# Patient Record
Sex: Male | Born: 1970 | Race: White | Hispanic: No | Marital: Married | Smoking: Former smoker
Health system: Southern US, Community
[De-identification: ages and names within clinical notes are randomized; demographics above are authoritative.]

## PROBLEM LIST (undated history)

## (undated) DIAGNOSIS — T4145XA Adverse effect of unspecified anesthetic, initial encounter: Secondary | ICD-10-CM

## (undated) DIAGNOSIS — J189 Pneumonia, unspecified organism: Secondary | ICD-10-CM

## (undated) DIAGNOSIS — T8859XA Other complications of anesthesia, initial encounter: Secondary | ICD-10-CM

## (undated) DIAGNOSIS — K5900 Constipation, unspecified: Secondary | ICD-10-CM

## (undated) HISTORY — PX: HERNIA REPAIR: SHX51

## (undated) HISTORY — PX: NASAL POLYP SURGERY: SHX186

## (undated) HISTORY — PX: BACK SURGERY: SHX140

## (undated) HISTORY — PX: NASAL SEPTUM SURGERY: SHX37

## (undated) HISTORY — PX: WISDOM TOOTH EXTRACTION: SHX21

---

## 2008-11-14 ENCOUNTER — Emergency Department (HOSPITAL_BASED_OUTPATIENT_CLINIC_OR_DEPARTMENT_OTHER): Admission: EM | Admit: 2008-11-14 | Discharge: 2008-11-14 | Payer: Self-pay | Admitting: Emergency Medicine

## 2013-09-29 ENCOUNTER — Ambulatory Visit (INDEPENDENT_AMBULATORY_CARE_PROVIDER_SITE_OTHER): Payer: 59

## 2013-09-29 ENCOUNTER — Encounter: Payer: Self-pay | Admitting: Sports Medicine

## 2013-09-29 ENCOUNTER — Ambulatory Visit (INDEPENDENT_AMBULATORY_CARE_PROVIDER_SITE_OTHER): Payer: 59 | Admitting: Sports Medicine

## 2013-09-29 VITALS — BP 127/76 | HR 74 | Ht 72.0 in | Wt 235.0 lb

## 2013-09-29 DIAGNOSIS — M5137 Other intervertebral disc degeneration, lumbosacral region: Secondary | ICD-10-CM

## 2013-09-29 DIAGNOSIS — M5416 Radiculopathy, lumbar region: Secondary | ICD-10-CM | POA: Insufficient documentation

## 2013-09-29 DIAGNOSIS — IMO0002 Reserved for concepts with insufficient information to code with codable children: Secondary | ICD-10-CM

## 2013-09-29 DIAGNOSIS — G35 Multiple sclerosis: Secondary | ICD-10-CM

## 2013-09-29 MED ORDER — MELOXICAM 15 MG PO TABS: ORAL_TABLET | ORAL | Status: DC

## 2013-09-29 MED ORDER — PREDNISONE 50 MG PO TABS: ORAL_TABLET | ORAL | Status: DC

## 2013-09-29 MED ORDER — KETOROLAC TROMETHAMINE 30 MG/ML IJ SOLN
30.0000 mg | Freq: Once | INTRAMUSCULAR | Status: AC
  Administered 2013-09-29: 30 mg via INTRAMUSCULAR

## 2013-09-29 MED ORDER — CYCLOBENZAPRINE HCL 10 MG PO TABS: ORAL_TABLET | ORAL | Status: DC

## 2013-09-29 NOTE — Progress Notes (Signed)
   Subjective:    I'm seeing this patient as a consultation for:  Dr. Olivia CanterWilliam Kelly  CC: Low back pain  HPI: This is a pleasant 43 year old male, for the past several months he's had pain in his low back radiating down the left leg in an S1 distribution, worse with Valsalva, flexion, driving a car. He denies any bowel or bladder dysfunction, trauma, or saddle numbness. Symptoms are moderate, persistent.  Past medical history, Surgical history, Family history not pertinant except as noted below, Social history, Allergies, and medications have been entered into the medical record, reviewed, and no changes needed.   Review of Systems: No headache, visual changes, nausea, vomiting, diarrhea, constipation, dizziness, abdominal pain, skin rash, fevers, chills, night sweats, weight loss, swollen lymph nodes, body aches, joint swelling, muscle aches, chest pain, shortness of breath, mood changes, visual or auditory hallucinations.   Objective:   General: Well Developed, well nourished, and in no acute distress.  Neuro/Psych: Alert and oriented x3, extra-ocular muscles intact, able to move all 4 extremities, sensation grossly intact. Skin: Warm and dry, no rashes noted.  Respiratory: Not using accessory muscles, speaking in full sentences, trachea midline.  Cardiovascular: Pulses palpable, no extremity edema. Abdomen: Does not appear distended. Back Exam:  Inspection: Unremarkable  Motion: Flexion 45 deg, Extension 45 deg, Side Bending to 45 deg bilaterally,  Rotation to 45 deg bilaterally  SLR laying: Positive with reproduction of left leg S1 radicular symptoms.  XSLR laying: Negative  Palpable tenderness: None. FABER: negative. Sensory change: Gross sensation intact to all lumbar and sacral dermatomes.  Reflexes: 2+ at both patellar tendons, 2+ at achilles tendons, Babinski's downgoing.  Strength at foot  Plantar-flexion: 5/5 Dorsi-flexion: 5/5 Eversion: 5/5 Inversion: 5/5  Leg strength    Quad: 5/5 Hamstring: 5/5 Hip flexor: 5/5 Hip abductors: 5/5  Gait unremarkable.  X-ray show L3-L4, L4-L5, and L5-S1 degenerative changes.  Impression and Recommendations:   This case required medical decision making of moderate complexity.

## 2013-09-29 NOTE — Assessment & Plan Note (Signed)
Toradol 30, prednisone, Mobic, Flexeril at bedtime. X-rays, formal physical therapy. Return in one month, MRI for better. Likely left-sided S1

## 2013-10-17 ENCOUNTER — Ambulatory Visit: Payer: 59 | Admitting: Physical Therapy

## 2015-01-15 ENCOUNTER — Ambulatory Visit (HOSPITAL_COMMUNITY)
Admission: RE | Admit: 2015-01-15 | Discharge: 2015-01-15 | Disposition: A | Discharge status: Home / Self Care | Payer: 59 | Source: Ambulatory Visit | Attending: Sports Medicine | Admitting: Sports Medicine

## 2015-01-15 ENCOUNTER — Ambulatory Visit (HOSPITAL_COMMUNITY): Payer: 59

## 2015-01-15 ENCOUNTER — Encounter: Payer: Self-pay | Admitting: Sports Medicine

## 2015-01-15 ENCOUNTER — Ambulatory Visit (INDEPENDENT_AMBULATORY_CARE_PROVIDER_SITE_OTHER): Payer: 59 | Admitting: Sports Medicine

## 2015-01-15 DIAGNOSIS — M5416 Radiculopathy, lumbar region: Secondary | ICD-10-CM | POA: Diagnosis not present

## 2015-01-15 DIAGNOSIS — M5126 Other intervertebral disc displacement, lumbar region: Secondary | ICD-10-CM | POA: Insufficient documentation

## 2015-01-15 MED ORDER — KETOROLAC TROMETHAMINE 30 MG/ML IJ SOLN
30.0000 mg | Freq: Once | INTRAMUSCULAR | Status: AC
  Administered 2015-01-15: 30 mg via INTRAMUSCULAR

## 2015-01-15 MED ORDER — HYDROCODONE-ACETAMINOPHEN 5-325 MG PO TABS: 1.0000 | ORAL_TABLET | Freq: Three times a day (TID) | ORAL | Status: DC | PRN

## 2015-01-15 MED ORDER — METHYLPREDNISOLONE SODIUM SUCC 125 MG IJ SOLR
125.0000 mg | Freq: Once | INTRAMUSCULAR | Status: AC
  Administered 2015-01-15: 125 mg via INTRAMUSCULAR

## 2015-01-15 MED ORDER — PREDNISONE 10 MG (21) PO TBPK: ORAL_TABLET | ORAL | Status: DC

## 2015-01-15 NOTE — Progress Notes (Signed)
  Subjective:    CC: low back pain  HPI: Bobby Petersen returns, I saw him sometime ago with acute low back pain and he did well with conservative measures, unfortunately over the past few days he's had worsening low back pain to the point he has had progressive weakness in his legs, without any paresthesias. No bowel or bladder dysfunction, saddle numbness, no constitutional symptoms. He is having great typically walking, sitting. Pain is severe today.  Past medical history, Surgical history, Family history not pertinant except as noted below, Social history, Allergies, and medications have been entered into the medical record, reviewed, and no changes needed.   Review of Systems: No fevers, chills, night sweats, weight loss, chest pain, or shortness of breath.   Objective:    General: Well Developed, well nourished, and in no acute distress.  Neuro: Alert and oriented x3, extra-ocular muscles intact, sensation grossly intact.  HEENT: Normocephalic, atraumatic, pupils equal round reactive to light, neck supple, no masses, no lymphadenopathy, thyroid nonpalpable.  Skin: Warm and dry, no rashes. Cardiac: Regular rate and rhythm, no murmurs rubs or gallops, no lower extremity edema.  Respiratory: Clear to auscultation bilaterally. Not using accessory muscles, speaking in full sentences. Back Exam:  Inspection: Unremarkable  Motion: Flexion 45 deg, Extension 45 deg, Side Bending to 45 deg bilaterally,  Rotation to 45 deg bilaterally  SLR laying: Negative  XSLR laying: Negative  Palpable tenderness: None. FABER: negative. Sensory change: Gross sensation intact to all lumbar and sacral dermatomes.  Reflexes: 2+ at both patellar tendons, 2+ at achilles tendons, Babinski's downgoing.  Strength at foot  Plantar-flexion: 5/5 Dorsi-flexion: 5/5 Eversion: 5/5 Inversion: 5/5  Leg strength  Quad: 5/5 Hamstring: 5/5 Hip flexor: 5/5 Hip abductors: 5/5  Gait unremarkable.  Toradol 30, soluMedrol 125  intramuscular today  Impression and Recommendations:

## 2015-01-15 NOTE — Assessment & Plan Note (Addendum)
Recurrent severe back pain with progressive weakness.  Toradol 30, Solu-Medrol 125, prednisone taper. Formal physical therapy, with considering weakness we are going to proceed with an MRI. Patient informed of the importance of getting an MRI considering progressive weakness, he was scheduled for an MRI later today, but declined doing it today to my nurse, again, informed of risks, benefits, alternatives.

## 2015-01-17 ENCOUNTER — Ambulatory Visit (INDEPENDENT_AMBULATORY_CARE_PROVIDER_SITE_OTHER): Payer: 59 | Admitting: Sports Medicine

## 2015-01-17 ENCOUNTER — Encounter: Payer: Self-pay | Admitting: Sports Medicine

## 2015-01-17 VITALS — BP 117/66 | HR 67 | Wt 233.0 lb

## 2015-01-17 DIAGNOSIS — R635 Abnormal weight gain: Secondary | ICD-10-CM | POA: Diagnosis not present

## 2015-01-17 DIAGNOSIS — M5416 Radiculopathy, lumbar region: Secondary | ICD-10-CM

## 2015-01-17 MED ORDER — PHENTERMINE HCL 37.5 MG PO TABS: ORAL_TABLET | ORAL | Status: DC

## 2015-01-17 NOTE — Assessment & Plan Note (Signed)
Doing much better with conservative treatment. Needs to start physical therapy. We will also work on weight loss.

## 2015-01-17 NOTE — Assessment & Plan Note (Signed)
Starting phentermine. Return monthly for weight checks and refills. 

## 2015-01-17 NOTE — Progress Notes (Signed)
  Subjective:    CC: MRI results  HPI: Bobby Petersen returns, he had fairly severe left-sided lumbar radiculopathy, we treated him at the last visit with Toradol, Solu-Medrol and he is currently finishing a prednisone taper, we did obtain an MRI due to lower extremity weakness. He returns today feeling significantly better, not having yet started physical therapy.  Past medical history, Surgical history, Family history not pertinant except as noted below, Social history, Allergies, and medications have been entered into the medical record, reviewed, and no changes needed.   Review of Systems: No fevers, chills, night sweats, weight loss, chest pain, or shortness of breath.   Objective:    General: Well Developed, well nourished, and in no acute distress.  Neuro: Alert and oriented x3, extra-ocular muscles intact, sensation grossly intact.  HEENT: Normocephalic, atraumatic, pupils equal round reactive to light, neck supple, no masses, no lymphadenopathy, thyroid nonpalpable.  Skin: Warm and dry, no rashes. Cardiac: Regular rate and rhythm, no murmurs rubs or gallops, no lower extremity edema.  Respiratory: Clear to auscultation bilaterally. Not using accessory muscles, speaking in full sentences.  MRI was personally reviewed, there is L4-5 and L5-S1 degenerative disc disease, the largest protrusion is at the L4-L5 level with a left-sided paracentral and foraminal component.  Impression and Recommendations:    I spent 40 minutes with this patient, greater than 50% was face-to-face time counseling regarding the above diagnoses

## 2015-02-15 ENCOUNTER — Encounter: Payer: Self-pay | Admitting: Sports Medicine

## 2015-02-15 ENCOUNTER — Ambulatory Visit (INDEPENDENT_AMBULATORY_CARE_PROVIDER_SITE_OTHER): Payer: 59 | Admitting: Sports Medicine

## 2015-02-15 DIAGNOSIS — R635 Abnormal weight gain: Secondary | ICD-10-CM | POA: Diagnosis not present

## 2015-02-15 MED ORDER — TOPIRAMATE 50 MG PO TABS: ORAL_TABLET | ORAL | Status: DC

## 2015-02-15 MED ORDER — PHENTERMINE HCL 37.5 MG PO TABS: ORAL_TABLET | ORAL | Status: DC

## 2015-02-15 NOTE — Progress Notes (Signed)
  Subjective:    CC: Follow-up  HPI: Obesity:  5 pound weight loss.  Did lose 10 pounds but gained it back traveling on the road for the past 2 weeks. No adverse effects, happy with how things are going so far.  Past medical history, Surgical history, Family history not pertinant except as noted below, Social history, Allergies, and medications have been entered into the medical record, reviewed, and no changes needed.   Review of Systems: No fevers, chills, night sweats, weight loss, chest pain, or shortness of breath.   Objective:    General: Well Developed, well nourished, and in no acute distress.  Neuro: Alert and oriented x3, extra-ocular muscles intact, sensation grossly intact.  HEENT: Normocephalic, atraumatic, pupils equal round reactive to light, neck supple, no masses, no lymphadenopathy, thyroid nonpalpable.  Skin: Warm and dry, no rashes. Cardiac: Regular rate and rhythm, no murmurs rubs or gallops, no lower extremity edema.  Respiratory: Clear to auscultation bilaterally. Not using accessory muscles, speaking in full sentences.  Impression and Recommendations:

## 2015-02-15 NOTE — Assessment & Plan Note (Signed)
5 pound weight loss, restarting phentermine, adding Topamax. Next line return monthly for weight checks and refills, we are entering the second month.

## 2015-03-15 ENCOUNTER — Ambulatory Visit (INDEPENDENT_AMBULATORY_CARE_PROVIDER_SITE_OTHER): Payer: 59 | Admitting: Sports Medicine

## 2015-03-15 ENCOUNTER — Encounter: Payer: Self-pay | Admitting: Sports Medicine

## 2015-03-15 DIAGNOSIS — R635 Abnormal weight gain: Secondary | ICD-10-CM | POA: Diagnosis not present

## 2015-03-15 DIAGNOSIS — M5416 Radiculopathy, lumbar region: Secondary | ICD-10-CM | POA: Diagnosis not present

## 2015-03-15 MED ORDER — TOPIRAMATE 100 MG PO TABS: 100.0000 mg | ORAL_TABLET | Freq: Every day | ORAL | Status: DC

## 2015-03-15 MED ORDER — AMBULATORY NON FORMULARY MEDICATION: Status: DC

## 2015-03-15 MED ORDER — PREDNISONE 50 MG PO TABS: ORAL_TABLET | ORAL | Status: DC

## 2015-03-15 MED ORDER — PHENTERMINE HCL 37.5 MG PO TABS: ORAL_TABLET | ORAL | Status: DC

## 2015-03-15 NOTE — Assessment & Plan Note (Signed)
Multilevel L4-5 and L5-S1 degenerative disc disease, with clear contact of the L4-L5 disc to the intraspinal L5 nerve root on the left.  symptoms correspond to a left L5 versus S1 radiculopathy.  prednisone 50 mg for 5 days, I am also going to set him up for a L4-L5 interlaminar epidural.  physical therapy does need to focus more on spinal stabilization  And traction rather than modalities on the extremity.

## 2015-03-15 NOTE — Progress Notes (Signed)
  Subjective:    CC: Follow-up  HPI: Obesity: 4 pound weight loss after the second month of phentermine.  Left lumbar radiculopathy: Recurrence of pain, moderate, persistent, he is only had trigger point injections, has never had an epidural. Does have tripped coming up and needs some rapid relief. His radicular symptoms are in an L5 versus in S1 distribution.  Past medical history, Surgical history, Family history not pertinant except as noted below, Social history, Allergies, and medications have been entered into the medical record, reviewed, and no changes needed.   Review of Systems: No fevers, chills, night sweats, weight loss, chest pain, or shortness of breath.   Objective:    General: Well Developed, well nourished, and in no acute distress.  Neuro: Alert and oriented x3, extra-ocular muscles intact, sensation grossly intact.  HEENT: Normocephalic, atraumatic, pupils equal round reactive to light, neck supple, no masses, no lymphadenopathy, thyroid nonpalpable.  Skin: Warm and dry, no rashes. Cardiac: Regular rate and rhythm, no murmurs rubs or gallops, no lower extremity edema.  Respiratory: Clear to auscultation bilaterally. Not using accessory muscles, speaking in full sentences.  Lumbar spine MRI again reviewed and shows protrusions at the L4-L5 and L5-S1 levels, the L4-L5 disc does appear to contact the intraspinal left L5 nerve root  Impression and Recommendations:    I spent 25 minutes with this patient, greater than 50% was face-to-face time counseling regarding the above diagnoses

## 2015-03-15 NOTE — Assessment & Plan Note (Signed)
Good weight loss as we entered the third month. Doubling Topamax to 100 mg daily, continue phentermine, return in a month.

## 2015-03-25 ENCOUNTER — Telehealth: Payer: Self-pay

## 2015-03-25 NOTE — Telephone Encounter (Signed)
Pt currently taking topamax to assist with weight loss but states he feels "funny" on the medication and would like to d/c use. Pt was wondering if there are any steps in particular he should take to stop medications. Please advise.

## 2015-03-25 NOTE — Telephone Encounter (Signed)
Left detailed message with call back information if any questions.

## 2015-03-25 NOTE — Telephone Encounter (Signed)
He is supposed to feel odd for the first week or 2. Push through any odd sensations and they will resolve.

## 2015-04-04 ENCOUNTER — Ambulatory Visit
Admission: RE | Admit: 2015-04-04 | Discharge: 2015-04-04 | Disposition: A | Discharge status: Home / Self Care | Payer: 59 | Source: Ambulatory Visit | Attending: Sports Medicine | Admitting: Sports Medicine

## 2015-04-04 MED ORDER — METHYLPREDNISOLONE ACETATE 40 MG/ML INJ SUSP (RADIOLOG
120.0000 mg | Freq: Once | INTRAMUSCULAR | Status: AC
Start: 2015-04-04 — End: 2015-04-04
  Administered 2015-04-04: 120 mg via EPIDURAL

## 2015-04-04 MED ORDER — IOHEXOL 180 MG/ML  SOLN
15.0000 mL | Freq: Once | INTRAMUSCULAR | Status: AC | PRN
Start: 2015-04-04 — End: 2015-04-04
  Administered 2015-04-04: 15 mL via EPIDURAL

## 2015-04-04 NOTE — Discharge Instructions (Signed)

## 2015-04-10 ENCOUNTER — Telehealth: Payer: Self-pay

## 2015-04-10 DIAGNOSIS — M5416 Radiculopathy, lumbar region: Secondary | ICD-10-CM

## 2015-04-10 NOTE — Telephone Encounter (Signed)
Pt had epidural on 04/04/15 and states his pain is really bad again and was told to call in if he has this happened again. Please advise.

## 2015-04-11 NOTE — Telephone Encounter (Signed)
The injection will take 4-7 days to even start working, did he get any improvement, or is he starting to get any improvement?

## 2015-04-11 NOTE — Telephone Encounter (Signed)
Left message on VM with call back information

## 2015-04-12 ENCOUNTER — Ambulatory Visit (INDEPENDENT_AMBULATORY_CARE_PROVIDER_SITE_OTHER): Payer: 59 | Admitting: Sports Medicine

## 2015-04-12 ENCOUNTER — Encounter: Payer: Self-pay | Admitting: Sports Medicine

## 2015-04-12 DIAGNOSIS — M5416 Radiculopathy, lumbar region: Secondary | ICD-10-CM

## 2015-04-12 DIAGNOSIS — R635 Abnormal weight gain: Secondary | ICD-10-CM | POA: Diagnosis not present

## 2015-04-12 MED ORDER — GABAPENTIN 300 MG PO CAPS: ORAL_CAPSULE | ORAL | Status: DC

## 2015-04-12 MED ORDER — LIRAGLUTIDE -WEIGHT MANAGEMENT 18 MG/3ML ~~LOC~~ SOPN: 3.0000 mg | PEN_INJECTOR | Freq: Every day | SUBCUTANEOUS | Status: DC

## 2015-04-12 NOTE — Telephone Encounter (Signed)
He already had an L4-L5 interlaminar epidural, if they mean a left-sided L4-L5 transforaminal epidural (selective nerve root block but NOT a facet joint block) then this is a reasonable idea.

## 2015-04-12 NOTE — Assessment & Plan Note (Signed)
Has not lost any weight on phentermine or Topamax, switching to [place].  Return in a nurse visit to learn injections.

## 2015-04-12 NOTE — Assessment & Plan Note (Signed)
Persistent radiculopathy despite steroids, epidural injection. At this point he is a surgical candidate, referral to Dr. Yevette Edwards, also adding gabapentin to improve radicular pain.

## 2015-04-12 NOTE — Progress Notes (Signed)
  Subjective:    CC: Follow-up  HPI: Obesity: Hasn't lost any weight on phentermine and Topamax.  Left lumbar radicular the colon with an L4-L5 disc protrusion, unfortunately no response to a L4-L5 interlaminar epidural. Pain is predominantly radicular with minimal axial pain. At this point he is agreeable to consider surgical referral.  Past medical history, Surgical history, Family history not pertinant except as noted below, Social history, Allergies, and medications have been entered into the medical record, reviewed, and no changes needed.   Review of Systems: No fevers, chills, night sweats, weight loss, chest pain, or shortness of breath.   Objective:    General: Well Developed, well nourished, and in no acute distress.  Neuro: Alert and oriented x3, extra-ocular muscles intact, sensation grossly intact.  HEENT: Normocephalic, atraumatic, pupils equal round reactive to light, neck supple, no masses, no lymphadenopathy, thyroid nonpalpable.  Skin: Warm and dry, no rashes. Cardiac: Regular rate and rhythm, no murmurs rubs or gallops, no lower extremity edema.  Respiratory: Clear to auscultation bilaterally. Not using accessory muscles, speaking in full sentences.  Impression and Recommendations:   I spent 40 minutes with this patient, greater than 50% was face-to-face time counseling regarding the above diagnoses

## 2015-04-12 NOTE — Telephone Encounter (Signed)
Pt states he's talked to a provider who recommends an L4-5 block. Wanted to ask your opinion on if this would be an option.

## 2015-04-16 NOTE — Telephone Encounter (Signed)
Pt would like to know how he can get this set up. States he still has an appointment with the surgeon you referred him to on 04/22/15.

## 2015-04-17 ENCOUNTER — Telehealth: Payer: Self-pay | Admitting: Radiology

## 2015-04-17 NOTE — Telephone Encounter (Signed)
Pt notified of orders placed at [facility].

## 2015-04-17 NOTE — Telephone Encounter (Signed)
Ordered epidural, please contact [facility] to schedule.

## 2015-04-17 NOTE — Telephone Encounter (Signed)
Pt called about injection he had on 04/04/15. Explained blood sugar could definitely be elevated after injection. Also asked me about a severe flair up on 12 28 16, told him that was unlikely caused by the injections.

## 2015-05-03 ENCOUNTER — Other Ambulatory Visit: Payer: Self-pay | Admitting: Orthopedic Surgery

## 2015-05-07 ENCOUNTER — Encounter (HOSPITAL_COMMUNITY): Payer: Self-pay | Admitting: *Deleted

## 2015-05-07 MED ORDER — POVIDONE-IODINE 7.5 % EX SOLN
Freq: Once | CUTANEOUS | Status: DC
  Filled 2015-05-07: qty 118

## 2015-05-07 MED ORDER — VANCOMYCIN HCL 10 G IV SOLR
1500.0000 mg | INTRAVENOUS | Status: AC
  Administered 2015-05-08: 1500 mg via INTRAVENOUS
  Filled 2015-05-07: qty 1500

## 2015-05-08 ENCOUNTER — Encounter (HOSPITAL_COMMUNITY): Payer: Self-pay | Admitting: Surgery

## 2015-05-08 ENCOUNTER — Ambulatory Visit (HOSPITAL_COMMUNITY): Payer: 59

## 2015-05-08 ENCOUNTER — Ambulatory Visit (HOSPITAL_COMMUNITY): Payer: 59 | Admitting: Anesthesiology

## 2015-05-08 ENCOUNTER — Encounter (HOSPITAL_COMMUNITY)
Admission: RE | Disposition: A | Discharge status: Home / Self Care | Payer: Self-pay | Source: Ambulatory Visit | Attending: Orthopedic Surgery

## 2015-05-08 ENCOUNTER — Ambulatory Visit (HOSPITAL_COMMUNITY)
Admission: RE | Admit: 2015-05-08 | Discharge: 2015-05-08 | Disposition: A | Discharge status: Home / Self Care | Payer: 59 | Source: Ambulatory Visit | Attending: Orthopedic Surgery | Admitting: Orthopedic Surgery

## 2015-05-08 DIAGNOSIS — M5116 Intervertebral disc disorders with radiculopathy, lumbar region: Secondary | ICD-10-CM | POA: Diagnosis not present

## 2015-05-08 DIAGNOSIS — Z87891 Personal history of nicotine dependence: Secondary | ICD-10-CM | POA: Diagnosis not present

## 2015-05-08 DIAGNOSIS — M5416 Radiculopathy, lumbar region: Secondary | ICD-10-CM | POA: Diagnosis present

## 2015-05-08 DIAGNOSIS — Z79891 Long term (current) use of opiate analgesic: Secondary | ICD-10-CM | POA: Insufficient documentation

## 2015-05-08 DIAGNOSIS — Z01818 Encounter for other preprocedural examination: Secondary | ICD-10-CM

## 2015-05-08 DIAGNOSIS — Z419 Encounter for procedure for purposes other than remedying health state, unspecified: Secondary | ICD-10-CM

## 2015-05-08 HISTORY — DX: Adverse effect of unspecified anesthetic, initial encounter: T41.45XA

## 2015-05-08 HISTORY — DX: Constipation, unspecified: K59.00

## 2015-05-08 HISTORY — PX: LUMBAR LAMINECTOMY/DECOMPRESSION MICRODISCECTOMY: SHX5026

## 2015-05-08 HISTORY — DX: Other complications of anesthesia, initial encounter: T88.59XA

## 2015-05-08 HISTORY — DX: Pneumonia, unspecified organism: J18.9

## 2015-05-08 LAB — URINALYSIS, ROUTINE W REFLEX MICROSCOPIC
BILIRUBIN URINE: NEGATIVE
Glucose, UA: NEGATIVE mg/dL
HGB URINE DIPSTICK: NEGATIVE
KETONES UR: NEGATIVE mg/dL
Leukocytes, UA: NEGATIVE
NITRITE: NEGATIVE
PH: 6 (ref 5.0–8.0)
Protein, ur: NEGATIVE mg/dL
SPECIFIC GRAVITY, URINE: 1.021 (ref 1.005–1.030)

## 2015-05-08 LAB — CBC WITH DIFFERENTIAL/PLATELET
Basophils Absolute: 0 10*3/uL (ref 0.0–0.1)
Basophils Relative: 0 %
Eosinophils Absolute: 0.1 10*3/uL (ref 0.0–0.7)
Eosinophils Relative: 3 %
HEMATOCRIT: 41.3 % (ref 39.0–52.0)
HEMOGLOBIN: 14.3 g/dL (ref 13.0–17.0)
LYMPHS ABS: 2.4 10*3/uL (ref 0.7–4.0)
LYMPHS PCT: 44 %
MCH: 30.2 pg (ref 26.0–34.0)
MCHC: 34.6 g/dL (ref 30.0–36.0)
MCV: 87.3 fL (ref 78.0–100.0)
MONO ABS: 0.6 10*3/uL (ref 0.1–1.0)
MONOS PCT: 11 %
NEUTROS ABS: 2.2 10*3/uL (ref 1.7–7.7)
Neutrophils Relative %: 42 %
Platelets: 215 10*3/uL (ref 150–400)
RBC: 4.73 MIL/uL (ref 4.22–5.81)
RDW: 12.9 % (ref 11.5–15.5)
WBC: 5.3 10*3/uL (ref 4.0–10.5)

## 2015-05-08 LAB — COMPREHENSIVE METABOLIC PANEL
ALK PHOS: 66 U/L (ref 38–126)
ALT: 21 U/L (ref 17–63)
ANION GAP: 10 (ref 5–15)
AST: 19 U/L (ref 15–41)
Albumin: 3.9 g/dL (ref 3.5–5.0)
BILIRUBIN TOTAL: 0.7 mg/dL (ref 0.3–1.2)
BUN: 14 mg/dL (ref 6–20)
CALCIUM: 9.4 mg/dL (ref 8.9–10.3)
CO2: 25 mmol/L (ref 22–32)
Chloride: 105 mmol/L (ref 101–111)
Creatinine, Ser: 1.1 mg/dL (ref 0.61–1.24)
GFR calc Af Amer: >60 mL/min (ref >60)
Glucose, Bld: 102 mg/dL — ABNORMAL HIGH (ref 65–99)
POTASSIUM: 3.9 mmol/L (ref 3.5–5.1)
Sodium: 140 mmol/L (ref 135–145)
TOTAL PROTEIN: 6.6 g/dL (ref 6.5–8.1)

## 2015-05-08 LAB — SURGICAL PCR SCREEN
MRSA, PCR: NEGATIVE
STAPHYLOCOCCUS AUREUS: NEGATIVE

## 2015-05-08 LAB — APTT: aPTT: 29 seconds (ref 24–37)

## 2015-05-08 LAB — PROTIME-INR
INR: 1.01 (ref 0.00–1.49)
PROTHROMBIN TIME: 13.5 s (ref 11.6–15.2)

## 2015-05-08 SURGERY — LUMBAR LAMINECTOMY/DECOMPRESSION MICRODISCECTOMY
Anesthesia: General | Laterality: Left

## 2015-05-08 MED ORDER — 0.9 % SODIUM CHLORIDE (POUR BTL) OPTIME
TOPICAL | Status: DC | PRN
  Administered 2015-05-08: 1000 mL

## 2015-05-08 MED ORDER — FENTANYL CITRATE (PF) 100 MCG/2ML IJ SOLN
INTRAMUSCULAR | Status: DC | PRN
  Administered 2015-05-08 (×4): 50 ug via INTRAVENOUS
  Administered 2015-05-08: 300 ug via INTRAVENOUS

## 2015-05-08 MED ORDER — THROMBIN 20000 UNITS EX SOLR
CUTANEOUS | Status: AC
  Filled 2015-05-08: qty 20000

## 2015-05-08 MED ORDER — LIDOCAINE HCL (CARDIAC) 20 MG/ML IV SOLN
INTRAVENOUS | Status: DC | PRN
  Administered 2015-05-08: 60 mg via INTRAVENOUS

## 2015-05-08 MED ORDER — HYDROMORPHONE HCL 1 MG/ML IJ SOLN
INTRAMUSCULAR | Status: AC
  Filled 2015-05-08: qty 1

## 2015-05-08 MED ORDER — SUGAMMADEX SODIUM 200 MG/2ML IV SOLN
INTRAVENOUS | Status: AC
  Filled 2015-05-08: qty 2

## 2015-05-08 MED ORDER — SODIUM CHLORIDE 0.9 % IV SOLN: INTRAVENOUS | Status: DC

## 2015-05-08 MED ORDER — THROMBIN 20000 UNITS EX SOLR
CUTANEOUS | Status: DC | PRN
  Administered 2015-05-08: 20 mL via TOPICAL

## 2015-05-08 MED ORDER — MIDAZOLAM HCL 5 MG/5ML IJ SOLN
INTRAMUSCULAR | Status: DC | PRN
  Administered 2015-05-08 (×2): 2 mg via INTRAVENOUS

## 2015-05-08 MED ORDER — OXYCODONE HCL 5 MG PO TABS: 5.0000 mg | ORAL_TABLET | Freq: Once | ORAL | Status: DC | PRN

## 2015-05-08 MED ORDER — ACETAMINOPHEN 160 MG/5ML PO SOLN: 325.0000 mg | ORAL | Status: DC | PRN

## 2015-05-08 MED ORDER — HYDROMORPHONE HCL 1 MG/ML IJ SOLN
0.2500 mg | INTRAMUSCULAR | Status: DC | PRN
  Administered 2015-05-08 (×4): 0.5 mg via INTRAVENOUS

## 2015-05-08 MED ORDER — SODIUM CHLORIDE 0.9 % IV SOLN
INTRAVENOUS | Status: DC | PRN
  Administered 2015-05-08: 12:00:00 via INTRAVENOUS

## 2015-05-08 MED ORDER — ONDANSETRON HCL 4 MG/2ML IJ SOLN
INTRAMUSCULAR | Status: DC | PRN
  Administered 2015-05-08 (×2): 4 mg via INTRAVENOUS

## 2015-05-08 MED ORDER — FENTANYL CITRATE (PF) 250 MCG/5ML IJ SOLN
INTRAMUSCULAR | Status: AC
  Filled 2015-05-08: qty 10

## 2015-05-08 MED ORDER — ACETAMINOPHEN 325 MG PO TABS: 325.0000 mg | ORAL_TABLET | ORAL | Status: DC | PRN

## 2015-05-08 MED ORDER — SODIUM CHLORIDE 0.9 % IV SOLN
10.0000 mg | INTRAVENOUS | Status: DC | PRN
  Administered 2015-05-08: 20 ug/min via INTRAVENOUS

## 2015-05-08 MED ORDER — SUGAMMADEX SODIUM 200 MG/2ML IV SOLN
INTRAVENOUS | Status: DC | PRN
  Administered 2015-05-08: 200 mg via INTRAVENOUS

## 2015-05-08 MED ORDER — METHYLENE BLUE 1 % INJ SOLN
INTRAMUSCULAR | Status: DC | PRN
  Administered 2015-05-08: 1 mL via SUBMUCOSAL

## 2015-05-08 MED ORDER — ROCURONIUM BROMIDE 100 MG/10ML IV SOLN
INTRAVENOUS | Status: DC | PRN
  Administered 2015-05-08: 50 mg via INTRAVENOUS

## 2015-05-08 MED ORDER — KETAMINE HCL 100 MG/ML IJ SOLN
INTRAMUSCULAR | Status: AC
  Filled 2015-05-08: qty 1

## 2015-05-08 MED ORDER — OXYCODONE HCL 5 MG/5ML PO SOLN: 5.0000 mg | Freq: Once | ORAL | Status: DC | PRN

## 2015-05-08 MED ORDER — MIDAZOLAM HCL 2 MG/2ML IJ SOLN
INTRAMUSCULAR | Status: AC
  Filled 2015-05-08: qty 2

## 2015-05-08 MED ORDER — METHYLPREDNISOLONE ACETATE 40 MG/ML IJ SUSP
INTRAMUSCULAR | Status: DC | PRN
  Administered 2015-05-08: 40 mg

## 2015-05-08 MED ORDER — LACTATED RINGERS IV SOLN
INTRAVENOUS | Status: DC
  Administered 2015-05-08: 11:00:00 via INTRAVENOUS

## 2015-05-08 MED ORDER — LACTATED RINGERS IV SOLN
INTRAVENOUS | Status: DC | PRN
  Administered 2015-05-08 (×2): via INTRAVENOUS

## 2015-05-08 MED ORDER — KETAMINE HCL 100 MG/ML IJ SOLN
INTRAMUSCULAR | Status: DC | PRN
  Administered 2015-05-08: 10 mg via INTRAVENOUS
  Administered 2015-05-08: 50 mg via INTRAVENOUS
  Administered 2015-05-08 (×2): 20 mg via INTRAVENOUS

## 2015-05-08 MED ORDER — BUPIVACAINE-EPINEPHRINE 0.25% -1:200000 IJ SOLN
INTRAMUSCULAR | Status: DC | PRN
  Administered 2015-05-08: 20 mL

## 2015-05-08 MED ORDER — ARTIFICIAL TEARS OP OINT
TOPICAL_OINTMENT | OPHTHALMIC | Status: DC | PRN
  Administered 2015-05-08: 1 via OPHTHALMIC

## 2015-05-08 MED ORDER — METHYLPREDNISOLONE ACETATE 40 MG/ML IJ SUSP
INTRAMUSCULAR | Status: AC
  Filled 2015-05-08: qty 1

## 2015-05-08 MED ORDER — PROPOFOL 10 MG/ML IV BOLUS
INTRAVENOUS | Status: DC | PRN
  Administered 2015-05-08: 30 mg via INTRAVENOUS
  Administered 2015-05-08: 170 mg via INTRAVENOUS

## 2015-05-08 MED ORDER — METHYLENE BLUE 1 % INJ SOLN
INTRAMUSCULAR | Status: AC
  Filled 2015-05-08: qty 10

## 2015-05-08 MED ORDER — BUPIVACAINE-EPINEPHRINE (PF) 0.25% -1:200000 IJ SOLN
INTRAMUSCULAR | Status: AC
  Filled 2015-05-08: qty 30

## 2015-05-08 MED ORDER — HEMOSTATIC AGENTS (NO CHARGE) OPTIME
TOPICAL | Status: DC | PRN
  Administered 2015-05-08: 1 via TOPICAL

## 2015-05-08 SURGICAL SUPPLY — 69 items
BENZOIN TINCTURE PRP APPL 2/3 (GAUZE/BANDAGES/DRESSINGS) ×2 IMPLANT
BUR ROUND PRECISION 4.0 (BURR) ×2 IMPLANT
CANISTER SUCTION 2500CC (MISCELLANEOUS) ×2 IMPLANT
CARTRIDGE OIL MAESTRO DRILL (MISCELLANEOUS) ×1 IMPLANT
CLSR STERI-STRIP ANTIMIC 1/2X4 (GAUZE/BANDAGES/DRESSINGS) ×2 IMPLANT
CORDS BIPOLAR (ELECTRODE) ×2 IMPLANT
COVER SURGICAL LIGHT HANDLE (MISCELLANEOUS) ×2 IMPLANT
DIFFUSER DRILL AIR PNEUMATIC (MISCELLANEOUS) ×2 IMPLANT
DRAIN CHANNEL 15F RND FF W/TCR (WOUND CARE) IMPLANT
DRAPE POUCH INSTRU U-SHP 10X18 (DRAPES) ×4 IMPLANT
DRAPE SURG 17X23 STRL (DRAPES) ×8 IMPLANT
DURAPREP 26ML APPLICATOR (WOUND CARE) ×2 IMPLANT
ELECT BLADE 4.0 EZ CLEAN MEGAD (MISCELLANEOUS) ×2
ELECT CAUTERY BLADE 6.4 (BLADE) ×2 IMPLANT
ELECT REM PT RETURN 9FT ADLT (ELECTROSURGICAL) ×2
ELECTRODE BLDE 4.0 EZ CLN MEGD (MISCELLANEOUS) ×1 IMPLANT
ELECTRODE REM PT RTRN 9FT ADLT (ELECTROSURGICAL) ×1 IMPLANT
EVACUATOR SILICONE 100CC (DRAIN) IMPLANT
FILTER STRAW FLUID ASPIR (MISCELLANEOUS) ×2 IMPLANT
GAUZE SPONGE 4X4 12PLY STRL (GAUZE/BANDAGES/DRESSINGS) ×2 IMPLANT
GAUZE SPONGE 4X4 16PLY XRAY LF (GAUZE/BANDAGES/DRESSINGS) ×2 IMPLANT
GLOVE BIO SURGEON STRL SZ7 (GLOVE) ×6 IMPLANT
GLOVE BIO SURGEON STRL SZ8 (GLOVE) ×2 IMPLANT
GLOVE BIOGEL PI IND STRL 7.0 (GLOVE) ×1 IMPLANT
GLOVE BIOGEL PI IND STRL 8 (GLOVE) ×1 IMPLANT
GLOVE BIOGEL PI INDICATOR 7.0 (GLOVE) ×1
GLOVE BIOGEL PI INDICATOR 8 (GLOVE) ×1
GOWN STRL REUS W/ TWL LRG LVL3 (GOWN DISPOSABLE) ×1 IMPLANT
GOWN STRL REUS W/ TWL XL LVL3 (GOWN DISPOSABLE) ×2 IMPLANT
GOWN STRL REUS W/TWL LRG LVL3 (GOWN DISPOSABLE) ×1
GOWN STRL REUS W/TWL XL LVL3 (GOWN DISPOSABLE) ×2
IV CATH 14GX2 1/4 (CATHETERS) ×2 IMPLANT
KIT BASIN OR (CUSTOM PROCEDURE TRAY) ×2 IMPLANT
KIT POSITION SURG JACKSON T1 (MISCELLANEOUS) ×2 IMPLANT
KIT ROOM TURNOVER OR (KITS) ×2 IMPLANT
NEEDLE 18GX1X1/2 (RX/OR ONLY) (NEEDLE) ×2 IMPLANT
NEEDLE 22X1 1/2 (OR ONLY) (NEEDLE) ×2 IMPLANT
NEEDLE HYPO 25GX1X1/2 BEV (NEEDLE) ×2 IMPLANT
NEEDLE SPNL 18GX3.5 QUINCKE PK (NEEDLE) ×6 IMPLANT
NS IRRIG 1000ML POUR BTL (IV SOLUTION) ×2 IMPLANT
OIL CARTRIDGE MAESTRO DRILL (MISCELLANEOUS) ×2
PACK LAMINECTOMY ORTHO (CUSTOM PROCEDURE TRAY) ×2 IMPLANT
PACK UNIVERSAL I (CUSTOM PROCEDURE TRAY) ×2 IMPLANT
PAD ARMBOARD 7.5X6 YLW CONV (MISCELLANEOUS) ×4 IMPLANT
PATTIES SURGICAL .5 X.5 (GAUZE/BANDAGES/DRESSINGS) IMPLANT
PATTIES SURGICAL .5 X1 (DISPOSABLE) ×2 IMPLANT
SPONGE INTESTINAL PEANUT (DISPOSABLE) ×2 IMPLANT
SPONGE SURGIFOAM ABS GEL 100 (HEMOSTASIS) ×2 IMPLANT
SPONGE SURGIFOAM ABS GEL SZ50 (HEMOSTASIS) ×2 IMPLANT
STRIP CLOSURE SKIN 1/2X4 (GAUZE/BANDAGES/DRESSINGS) IMPLANT
SURGIFLO W/THROMBIN 8M KIT (HEMOSTASIS) IMPLANT
SUT MNCRL AB 4-0 PS2 18 (SUTURE) ×2 IMPLANT
SUT VIC AB 0 CT1 18XCR BRD 8 (SUTURE) IMPLANT
SUT VIC AB 0 CT1 27 (SUTURE)
SUT VIC AB 0 CT1 27XBRD ANBCTR (SUTURE) IMPLANT
SUT VIC AB 0 CT1 8-18 (SUTURE)
SUT VIC AB 1 CT1 18XCR BRD 8 (SUTURE) ×1 IMPLANT
SUT VIC AB 1 CT1 8-18 (SUTURE) ×1
SUT VIC AB 2-0 CT2 18 VCP726D (SUTURE) ×2 IMPLANT
SYR 20CC LL (SYRINGE) ×2 IMPLANT
SYR BULB IRRIGATION 50ML (SYRINGE) ×2 IMPLANT
SYR CONTROL 10ML LL (SYRINGE) ×4 IMPLANT
SYR TB 1ML 26GX3/8 SAFETY (SYRINGE) ×4 IMPLANT
SYR TB 1ML LUER SLIP (SYRINGE) ×4 IMPLANT
TAPE CLOTH SURG 6X10 WHT LF (GAUZE/BANDAGES/DRESSINGS) ×2 IMPLANT
TOWEL OR 17X24 6PK STRL BLUE (TOWEL DISPOSABLE) ×2 IMPLANT
TOWEL OR 17X26 10 PK STRL BLUE (TOWEL DISPOSABLE) ×2 IMPLANT
WATER STERILE IRR 1000ML POUR (IV SOLUTION) ×2 IMPLANT
YANKAUER SUCT BULB TIP NO VENT (SUCTIONS) ×2 IMPLANT

## 2015-05-08 NOTE — Anesthesia Procedure Notes (Signed)
Procedure Name: Intubation Date/Time: 05/08/2015 12:14 PM Performed by: Wray Kearns A Pre-anesthesia Checklist: Patient identified, Emergency Drugs available, Patient being monitored, Suction available and Timeout performed Patient Re-evaluated:Patient Re-evaluated prior to inductionOxygen Delivery Method: Circle system utilized Preoxygenation: Pre-oxygenation with 100% oxygen Intubation Type: IV induction and Cricoid Pressure applied Ventilation: Mask ventilation without difficulty and Oral airway inserted - appropriate to patient size Laryngoscope Size: Mac and 4 Grade View: Grade II Tube type: Oral Tube size: 7.5 mm Number of attempts: 1 Airway Equipment and Method: Stylet Placement Confirmation: ETT inserted through vocal cords under direct vision,  positive ETCO2 and breath sounds checked- equal and bilateral Secured at: 24 cm Tube secured with: Tape Dental Injury: Teeth and Oropharynx as per pre-operative assessment

## 2015-05-08 NOTE — Op Note (Signed)
NAME:  Bobby Petersen, Bobby Petersen NO.:  [id number]  MEDICAL RECORD NO.:  [id number]  LOCATION:  [facility]                         FACILITY:  [facility]  PHYSICIAN:  Estill Bamberg, MD      DATE OF BIRTH:  [birth year]  DATE OF PROCEDURE:  05/08/2015 DATE OF DISCHARGE:  05/08/2015                              OPERATIVE REPORT   PREOPERATIVE DIAGNOSES: 1. Left-sided L5 radiculopathy, chronic. 2. Left-sided L4-5 disk protrusion compressing the left L5 nerve.  POSTOPERATIVE DIAGNOSES: 1. Left-sided L5 radiculopathy, chronic. 2. Left-sided L4-5 disk protrusion compressing the left L5 nerve.  PROCEDURE:  Left-sided L4-5 microdiskectomy with left-sided partial facetectomy and removal of extruded left L4-5 disk fragment.  SURGEON:  Estill Bamberg, MD  ASSISTANT:  Jason Coop, PA-C  ANESTHESIA:  General endotracheal anesthesia.  COMPLICATIONS:  None.  DISPOSITION:  Stable.  ESTIMATED BLOOD LOSS:  Minimal.  INDICATIONS FOR SURGERY:  Briefly, Mr. Mohabir is a pleasant 45 year old male, who did initially present to me with ongoing pain in the left leg and additional left leg numbness.  Of note, the patient states that he has had symptoms for over a year, but his pain was increased over the course of the past 2 months.  The patient did have epidural injections, which did help him temporarily, his pain did continue.  We therefore did discuss proceeding with a microdiskectomy procedure.  The patient was fully aware of the risks and limitations of the procedure and did elect to proceed.  OPERATIVE DETAILS:  On May 08, 2015, patient was brought to surgery and general endotracheal anesthesia was administered.  The patient was placed prone on a flat Jackson bed with a Wilson frame.  Antibiotics were given.  A time-out was performed.  The back was prepped and draped and a midline incision was made overlying the L4-5 intervertebral space. A curvilinear incision was made to the left of  the midline of the fascia at L4-5.  The paraspinal musculature was bluntly swept laterally.  A self-retaining retractor was placed.  Using a high-speed bur, I did remove the medial and inferior aspect of the L4 lamina.  The superior most aspect of the L5 lamina was also removed.  The traversing L5 nerve was identified and noted to be under tension.  I was able to retract the L5 nerve medially.  Immediately ventral to the nerve was noted to be what appeared to be chronic L4-5 disk fragment, migrated behind the L5 vertebral body.  There were also chronic portions at the level of the L4- 5 intervertebral space as well.  I did remove the protruded L4-5 disk fragment and the fragment was migrated behind the L5 vertebral body.  In doing so, I was able to decompress the L5 nerve.  I then controlled all epidural bleeding using Surgiflo in addition to bipolar electrocautery. The wound was copiously irrigated.  The wound was then closed in layers using #1 Vicryl followed by 2-0 Vicryl, followed by 3-0 Monocryl. Benzoin and Steri-Strips were applied followed by sterile dressing.  All instrument counts were correct at the termination of the procedure.  Of note, Jason Coop was my assistant throughout surgery, and did aid in retraction, suctioning, and closure  from start to finish.     Estill Bamberg, MD     MD/MEDQ  D:  05/08/2015  T:  05/08/2015  Job:  [id number]  cc:   Monica Becton, MD

## 2015-05-08 NOTE — Discharge Instructions (Signed)
Patient discharged with written Rx: for Percocet 5/325mg  1 tablet every 4-6 hours as needed for pain, and Valium 5mg  1 tablet every 6 hours as needed for spasms Patient also sent home with printed home instructions per Dr. Yevette Edwards Follow up with Dr. Yevette Edwards on 05/15/15 at 8:15 am

## 2015-05-08 NOTE — Anesthesia Postprocedure Evaluation (Signed)
Anesthesia Post Note  Patient: Bobby Petersen  Procedure(s) Performed: Procedure(s) (LRB): LUMBAR LAMINECTOMY/DECOMPRESSION MICRODISCECTOMY (Left)  Patient location during evaluation: PACU Anesthesia Type: General Level of consciousness: awake and alert Pain management: pain level controlled Vital Signs Assessment: post-procedure vital signs reviewed and stable Respiratory status: spontaneous breathing, nonlabored ventilation and respiratory function stable Cardiovascular status: blood pressure returned to baseline and stable Postop Assessment: no signs of nausea or vomiting Anesthetic complications: no    Last Vitals:  Filed Vitals:   05/08/15 1600 05/08/15 1607  BP: 116/73 130/84  Pulse: 86 77  Temp: 36.6 C   Resp: 10 16    Last Pain:  Filed Vitals:   05/08/15 1609  PainSc: 7                  Sania Noy A

## 2015-05-08 NOTE — H&P (Signed)
     PREOPERATIVE H&P  Chief Complaint: L leg pain  HPI: Bobby Petersen is a 45 y.o. male who presents with ongoing pain in the left leg x 2 months  MRI reveals a left L4/5 HNP compressing the left L5 nerve  Patient has failed multiple forms of conservative care and continues to have pain (see office notes for additional details regarding the patient's full course of treatment)  Past Medical History  Diagnosis Date  . Complication of anesthesia     had 2 surgery closed together and stayed drunk foer a bit  . Pneumonia     as a teenager  . Constipation    Past Surgical History  Procedure Laterality Date  . Hernia repair Left   . Nasal polyp surgery    . Nasal septum surgery    . Wisdom tooth extraction     Social History   Social History  . Marital Status: Married    Spouse Name: N/A  . Number of Children: N/A  . Years of Education: N/A   Social History Main Topics  . Smoking status: Former Games developer  . Smokeless tobacco: None     Comment: off and on for  years  . Alcohol Use: No  . Drug Use: No  . Sexual Activity: Not Asked   Other Topics Concern  . None   Social History Narrative   History reviewed. No pertinent family history. Allergies  Allergen Reactions  . Penicillins     Childhood allergy   Prior to Admission medications   Medication Sig Start Date End Date Taking? Authorizing Provider  AMBULATORY NON FORMULARY MEDICATION Needs convertible sit/stand workstation or desk, with associated chair, these are medically necessary. 03/15/15  Yes Monica Becton, MD  HYDROcodone-acetaminophen (NORCO/VICODIN) 5-325 MG tablet Take 1 tablet by mouth at bedtime.   Yes Historical Provider, MD  gabapentin (NEURONTIN) 300 MG capsule One tab PO qHS for a week, then BID for a week, then TID. May double weekly to a max of 3,600mg /day Patient not taking: Reported on 05/06/2015 04/12/15   Monica Becton, MD  Liraglutide -Weight Management (SAXENDA) 18 MG/3ML SOPN  Inject 3 mg into the skin daily. 0.6 mg inj subcut daily for 1 week, then incr by 0.6 mg weekly until reaching 3 mg injected subcut daily Patient not taking: Reported on 05/06/2015 04/12/15   Monica Becton, MD     All other systems have been reviewed and were otherwise negative with the exception of those mentioned in the HPI and as above.  Physical Exam: There were no vitals filed for this visit.  General: Alert, no acute distress Cardiovascular: No pedal edema Respiratory: No cyanosis, no use of accessory musculature Skin: No lesions in the area of chief complaint Neurologic: Sensation intact distally Psychiatric: Patient is competent for consent with normal mood and affect Lymphatic: No axillary or cervical lymphadenopathy  MUSCULOSKELETAL: + SLR on left  Assessment/Plan: Radiculopathy Plan for Procedure(s): LUMBAR LAMINECTOMY/DECOMPRESSION MICRODISCECTOMY   Emilee Hero, MD 05/08/2015 7:58 AM

## 2015-05-08 NOTE — Transfer of Care (Signed)
Immediate Anesthesia Transfer of Care Note  Patient: Bobby Petersen  Procedure(s) Performed: Procedure(s) with comments: LUMBAR LAMINECTOMY/DECOMPRESSION MICRODISCECTOMY (Left) - Left sided lumbar 4-5 microdisectomy  Patient Location: PACU  Anesthesia Type:General  Level of Consciousness: awake, alert , oriented and patient cooperative  Airway & Oxygen Therapy: Patient Spontanous Breathing and Patient connected to nasal cannula oxygen  Post-op Assessment: Report given to RN and Post -op Vital signs reviewed and stable  Post vital signs: Reviewed and stable  Last Vitals:  Filed Vitals:   05/08/15 1039 05/08/15 1502  BP: 112/83 123/82  Pulse: 76 87  Temp: 36.3 C 36.4 C  Resp: 18 12    Complications: No apparent anesthesia complications

## 2015-05-08 NOTE — Anesthesia Preprocedure Evaluation (Signed)
Anesthesia Evaluation  Patient identified by MRN, date of birth, ID band Patient awake    Reviewed: Allergy & Precautions, NPO status , Patient's Chart, lab work & pertinent test results  History of Anesthesia Complications (+) Emergence Delirium and history of anesthetic complications  Airway Mallampati: III  TM Distance: >3 FB Neck ROM: Full    Dental  (+) Teeth Intact   Pulmonary neg shortness of breath, neg sleep apnea, neg COPD, neg recent URI, former smoker, neg PE   breath sounds clear to auscultation       Cardiovascular negative cardio ROS   Rhythm:Regular     Neuro/Psych neg Seizures  Neuromuscular disease negative psych ROS   GI/Hepatic negative GI ROS, Neg liver ROS,   Endo/Other  negative endocrine ROS  Renal/GU negative Renal ROS     Musculoskeletal   Abdominal   Peds  Hematology negative hematology ROS (+)   Anesthesia Other Findings   Reproductive/Obstetrics                             Anesthesia Physical Anesthesia Plan  ASA: II  Anesthesia Plan: General   Post-op Pain Management:    Induction: Intravenous  Airway Management Planned: Oral ETT  Additional Equipment: None  Intra-op Plan:   Post-operative Plan: Extubation in OR  Informed Consent: I have reviewed the patients History and Physical, chart, labs and discussed the procedure including the risks, benefits and alternatives for the proposed anesthesia with the patient or authorized representative who has indicated his/her understanding and acceptance.   Dental advisory given  Plan Discussed with: CRNA and Surgeon  Anesthesia Plan Comments:         Anesthesia Quick Evaluation

## 2015-05-09 ENCOUNTER — Encounter (HOSPITAL_COMMUNITY): Payer: Self-pay | Admitting: Orthopedic Surgery

## 2015-05-10 ENCOUNTER — Ambulatory Visit: Payer: 59 | Admitting: Sports Medicine

## 2015-06-24 ENCOUNTER — Other Ambulatory Visit: Payer: Self-pay | Admitting: Family Medicine

## 2015-06-24 ENCOUNTER — Encounter: Payer: Self-pay | Admitting: *Deleted

## 2015-06-24 ENCOUNTER — Emergency Department (INDEPENDENT_AMBULATORY_CARE_PROVIDER_SITE_OTHER)
Admission: EM | Admit: 2015-06-24 | Discharge: 2015-06-24 | Disposition: A | Discharge status: Home / Self Care | Payer: 59 | Source: Home / Self Care | Attending: Family Medicine | Admitting: Family Medicine

## 2015-06-24 DIAGNOSIS — J111 Influenza due to unidentified influenza virus with other respiratory manifestations: Secondary | ICD-10-CM | POA: Diagnosis not present

## 2015-06-24 DIAGNOSIS — S30860A Insect bite (nonvenomous) of lower back and pelvis, initial encounter: Secondary | ICD-10-CM | POA: Diagnosis not present

## 2015-06-24 DIAGNOSIS — R69 Illness, unspecified: Secondary | ICD-10-CM

## 2015-06-24 DIAGNOSIS — W57XXXA Bitten or stung by nonvenomous insect and other nonvenomous arthropods, initial encounter: Secondary | ICD-10-CM | POA: Diagnosis not present

## 2015-06-24 MED ORDER — OSELTAMIVIR PHOSPHATE 75 MG PO CAPS: 75.0000 mg | ORAL_CAPSULE | Freq: Two times a day (BID) | ORAL | Status: DC

## 2015-06-24 MED ORDER — DOXYCYCLINE HYCLATE 100 MG PO CAPS: 100.0000 mg | ORAL_CAPSULE | Freq: Two times a day (BID) | ORAL | Status: DC

## 2015-06-24 NOTE — ED Provider Notes (Signed)
CSN: [id number]     Arrival date & time 06/24/15  1544 History   First MD Initiated Contact with Patient 06/24/15 1629     Chief Complaint  Patient presents with  . Insect Bite  . Fever      HPI Comments: Patient developed runny nose and mild headache 2 days ago.  Today he developed myalgias and fever to 100. Yesterday he discovered a tick bite on his left lower back, although the tick was not engorged.  He had been working in the woods one week ago.  The history is provided by the patient.    Past Medical History  Diagnosis Date  . Complication of anesthesia     had 2 surgery closed together and stayed drunk foer a bit  . Pneumonia     as a teenager  . Constipation    Past Surgical History  Procedure Laterality Date  . Hernia repair Left   . Nasal polyp surgery    . Nasal septum surgery    . Wisdom tooth extraction    . Lumbar laminectomy/decompression microdiscectomy Left 05/08/2015    Procedure: LUMBAR LAMINECTOMY/DECOMPRESSION MICRODISCECTOMY;  Surgeon: Estill Bamberg, MD;  Location: [facility] OR;  Service: Orthopedics;  Laterality: Left;  Left sided lumbar 4-5 microdisectomy   History reviewed. No pertinent family history. Social History  Substance Use Topics  . Smoking status: Former Games developer  . Smokeless tobacco: None     Comment: off and on for  years  . Alcohol Use: No    Review of Systems No sore throat No cough No pleuritic pain No wheezing + nasal congestion + post-nasal drainage No sinus pain/pressure No itchy/red eyes No earache No hemoptysis No SOB + fever, + chills No nausea No vomiting No abdominal pain No diarrhea No urinary symptoms No skin rash + fatigue + myalgias + headache Used OTC meds without relief  Allergies  Penicillins  Home Medications   Prior to Admission medications   Medication Sig Start Date End Date Taking? Authorizing Provider  AMBULATORY NON FORMULARY MEDICATION Needs convertible sit/stand workstation or desk, with  associated chair, these are medically necessary. 03/15/15   Monica Becton, MD  doxycycline (VIBRAMYCIN) 100 MG capsule Take 1 capsule (100 mg total) by mouth 2 (two) times daily. 06/24/15   Lattie Haw, MD  gabapentin (NEURONTIN) 300 MG capsule One tab PO qHS for a week, then BID for a week, then TID. May double weekly to a max of 3,600mg /day Patient not taking: Reported on 05/06/2015 04/12/15   Monica Becton, MD  Liraglutide -Weight Management (SAXENDA) 18 MG/3ML SOPN Inject 3 mg into the skin daily. 0.6 mg inj subcut daily for 1 week, then incr by 0.6 mg weekly until reaching 3 mg injected subcut daily Patient not taking: Reported on 05/06/2015 04/12/15   Monica Becton, MD  oseltamivir (TAMIFLU) 75 MG capsule Take 1 capsule (75 mg total) by mouth every 12 (twelve) hours. 06/24/15   Lattie Haw, MD   Meds Ordered and Administered this Visit  Medications - No data to display  BP 118/79 mmHg  Pulse 83  Temp(Src) 98.6 F (37 C) (Oral)  Resp 18  Ht 6' (1.829 m)  Wt 234 lb (106.142 kg)  BMI 31.73 kg/m2  SpO2 99% No data found.   Physical Exam  Skin:     4mm macule without surrounding erythema   Nursing notes and Vital Signs reviewed. Appearance:  Patient appears stated age, and in no acute distress Eyes:  Pupils are equal, round, and reactive to light and accomodation.  Extraocular movement is intact.  Conjunctivae are not inflamed  Ears:  Canals normal.  Tympanic membranes normal.  Nose:  Mildly congested turbinates.  No sinus tenderness.  Pharynx:  Normal Neck:  Supple.  Tender enlarged posterior nodes are palpated bilaterally  Lungs:  Clear to auscultation.  Breath sounds are equal.  Moving air well. Heart:  Regular rate and rhythm without murmurs, rubs, or gallops.  Abdomen:  Nontender without masses or hepatosplenomegaly.  Bowel sounds are present.  No CVA or flank tenderness.  Extremities:  No edema.  Skin:  See skin exam above     ED Course    Procedures none    Labs Reviewed  ROCKY MTN SPOTTED FVR ABS PNL(IGG+IGM)  B. BURGDORFI ANTIBODIES      MDM   1. Tick bite of back, initial encounter   2. Influenza-like illness    Check RMSF and lyme disease antibodies.  Begin empiric doxycycline 100mg  BID. Begin Tamiflu. Take plain guaifenesin (1200mg  extended release tabs such as Mucinex) twice daily, with plenty of water, for cough and congestion.  May add Pseudoephedrine (30mg , one or two every 4 to 6 hours) for sinus congestion.  Get adequate rest.   May use Afrin nasal spray (or generic oxymetazoline) twice daily for about 5 days and then discontinue.  Also recommend using saline nasal spray several times daily and saline nasal irrigation (AYR is a common brand).  Use Flonase nasal spray each morning after using Afrin nasal spray and saline nasal irrigation. Try warm salt water gargles for sore throat.  Stop all antihistamines for now, and other non-prescription cough/cold preparations. May take Ibuprofen 200mg , 4 tabs every 8 hours with food for body aches, fever, etc. May take Delsym Cough Suppressant at bedtime for nighttime cough.  Follow-up with family doctor if not improving about10 days.     Lattie Haw, MD 07/02/15 [phone]

## 2015-06-24 NOTE — Discharge Instructions (Signed)
Take plain guaifenesin (1200mg extended release tabs such as Mucinex) twice daily, with plenty of water, for cough and congestion.  May add Pseudoephedrine (30mg, one or two every 4 to 6 hours) for sinus congestion.  Get adequate rest.   °May use Afrin nasal spray (or generic oxymetazoline) twice daily for about 5 days and then discontinue.  Also recommend using saline nasal spray several times daily and saline nasal irrigation (AYR is a common brand).  Use Flonase nasal spray each morning after using Afrin nasal spray and saline nasal irrigation. °Try warm salt water gargles for sore throat.  °Stop all antihistamines for now, and other non-prescription cough/cold preparations. °May take Ibuprofen 200mg, 4 tabs every 8 hours with food for body aches, fever, etc. °May take Delsym Cough Suppressant at bedtime for nighttime cough.  °Follow-up with family doctor if not improving about10 days.  °

## 2015-06-24 NOTE — ED Notes (Signed)
Pt c/o tick bite on his LT lower back x 1 day ago. He also, c/o runny nose x 1 day, with fever 100.0 and body aches x today.

## 2015-06-25 LAB — LYME AB/WESTERN BLOT REFLEX

## 2015-06-26 LAB — ROCKY MTN SPOTTED FVR ABS PNL(IGG+IGM)
RMSF IGG: 2.35 IV — AB
RMSF IGM: 0.31 IV

## 2015-07-01 ENCOUNTER — Telehealth: Payer: Self-pay | Admitting: *Deleted

## 2015-07-31 ENCOUNTER — Telehealth: Payer: Self-pay | Admitting: Emergency Medicine

## 2016-07-03 ENCOUNTER — Encounter: Payer: Self-pay | Admitting: Osteopathic Medicine

## 2016-07-03 ENCOUNTER — Encounter (HOSPITAL_BASED_OUTPATIENT_CLINIC_OR_DEPARTMENT_OTHER): Payer: Self-pay | Admitting: Emergency Medicine

## 2016-07-03 ENCOUNTER — Telehealth: Payer: Self-pay

## 2016-07-03 ENCOUNTER — Emergency Department (HOSPITAL_BASED_OUTPATIENT_CLINIC_OR_DEPARTMENT_OTHER)
Admission: EM | Admit: 2016-07-03 | Discharge: 2016-07-03 | Disposition: A | Discharge status: Home / Self Care | Payer: 59 | Attending: Emergency Medicine | Admitting: Emergency Medicine

## 2016-07-03 ENCOUNTER — Ambulatory Visit (HOSPITAL_BASED_OUTPATIENT_CLINIC_OR_DEPARTMENT_OTHER)
Admission: RE | Admit: 2016-07-03 | Discharge: 2016-07-03 | Disposition: A | Discharge status: Home / Self Care | Payer: 59 | Source: Ambulatory Visit | Attending: Osteopathic Medicine | Admitting: Osteopathic Medicine

## 2016-07-03 ENCOUNTER — Ambulatory Visit (INDEPENDENT_AMBULATORY_CARE_PROVIDER_SITE_OTHER): Payer: 59 | Admitting: Osteopathic Medicine

## 2016-07-03 VITALS — BP 97/58 | HR 77 | Ht 77.0 in | Wt 237.0 lb

## 2016-07-03 DIAGNOSIS — R031 Nonspecific low blood-pressure reading: Secondary | ICD-10-CM

## 2016-07-03 DIAGNOSIS — R509 Fever, unspecified: Secondary | ICD-10-CM | POA: Diagnosis present

## 2016-07-03 DIAGNOSIS — Z8659 Personal history of other mental and behavioral disorders: Secondary | ICD-10-CM | POA: Diagnosis not present

## 2016-07-03 DIAGNOSIS — Z87891 Personal history of nicotine dependence: Secondary | ICD-10-CM | POA: Diagnosis not present

## 2016-07-03 DIAGNOSIS — R51 Headache: Secondary | ICD-10-CM | POA: Diagnosis not present

## 2016-07-03 DIAGNOSIS — R0789 Other chest pain: Secondary | ICD-10-CM | POA: Diagnosis not present

## 2016-07-03 DIAGNOSIS — M79662 Pain in left lower leg: Secondary | ICD-10-CM

## 2016-07-03 DIAGNOSIS — R5383 Other fatigue: Secondary | ICD-10-CM | POA: Diagnosis not present

## 2016-07-03 DIAGNOSIS — R251 Tremor, unspecified: Secondary | ICD-10-CM

## 2016-07-03 DIAGNOSIS — Z7901 Long term (current) use of anticoagulants: Secondary | ICD-10-CM | POA: Insufficient documentation

## 2016-07-03 DIAGNOSIS — R7989 Other specified abnormal findings of blood chemistry: Secondary | ICD-10-CM

## 2016-07-03 DIAGNOSIS — R69 Illness, unspecified: Secondary | ICD-10-CM

## 2016-07-03 DIAGNOSIS — R002 Palpitations: Secondary | ICD-10-CM | POA: Diagnosis not present

## 2016-07-03 DIAGNOSIS — J111 Influenza due to unidentified influenza virus with other respiratory manifestations: Secondary | ICD-10-CM

## 2016-07-03 LAB — CBC
HEMATOCRIT: 40.3 % (ref 38.5–50.0)
HEMOGLOBIN: 13.9 g/dL (ref 13.2–17.1)
MCH: 30.2 pg (ref 27.0–33.0)
MCHC: 34.5 g/dL (ref 32.0–36.0)
MCV: 87.6 fL (ref 80.0–100.0)
MPV: 9 fL (ref 7.5–12.5)
Platelets: 246 10*3/uL (ref 140–400)
RBC: 4.6 MIL/uL (ref 4.20–5.80)
RDW: 13.5 % (ref 11.0–15.0)
WBC: 11.9 10*3/uL — ABNORMAL HIGH (ref 3.8–10.8)

## 2016-07-03 LAB — COMPLETE METABOLIC PANEL WITH GFR
ALBUMIN: 4 g/dL (ref 3.6–5.1)
ALK PHOS: 69 U/L (ref 40–115)
ALT: 31 U/L (ref 9–46)
AST: 22 U/L (ref 10–40)
BILIRUBIN TOTAL: 0.4 mg/dL (ref 0.2–1.2)
BUN: 17 mg/dL (ref 7–25)
CALCIUM: 9 mg/dL (ref 8.6–10.3)
CO2: 28 mmol/L (ref 20–31)
Chloride: 104 mmol/L (ref 98–110)
Creat: 1.1 mg/dL (ref 0.60–1.35)
GFR, EST NON AFRICAN AMERICAN: 80 mL/min (ref >=60)
GLUCOSE: 89 mg/dL (ref 65–99)
POTASSIUM: 4.1 mmol/L (ref 3.5–5.3)
SODIUM: 140 mmol/L (ref 135–146)
TOTAL PROTEIN: 6.4 g/dL (ref 6.1–8.1)

## 2016-07-03 LAB — CBC WITH DIFFERENTIAL/PLATELET
Basophils Absolute: 0 10*3/uL (ref 0.0–0.1)
Basophils Relative: 0 %
EOS ABS: 0.1 10*3/uL (ref 0.0–0.7)
EOS PCT: 1 %
HCT: 35.6 % — ABNORMAL LOW (ref 39.0–52.0)
Hemoglobin: 12.8 g/dL — ABNORMAL LOW (ref 13.0–17.0)
LYMPHS ABS: 1.6 10*3/uL (ref 0.7–4.0)
Lymphocytes Relative: 18 %
MCH: 30.5 pg (ref 26.0–34.0)
MCHC: 36 g/dL (ref 30.0–36.0)
MCV: 85 fL (ref 78.0–100.0)
MONO ABS: 0.7 10*3/uL (ref 0.1–1.0)
Monocytes Relative: 8 %
Neutro Abs: 6.6 10*3/uL (ref 1.7–7.7)
Neutrophils Relative %: 73 %
PLATELETS: 225 10*3/uL (ref 150–400)
RBC: 4.19 MIL/uL — AB (ref 4.22–5.81)
RDW: 13.1 % (ref 11.5–15.5)
WBC: 9 10*3/uL (ref 4.0–10.5)

## 2016-07-03 LAB — URINALYSIS, ROUTINE W REFLEX MICROSCOPIC
BILIRUBIN URINE: NEGATIVE
GLUCOSE, UA: NEGATIVE mg/dL
HGB URINE DIPSTICK: NEGATIVE
KETONES UR: NEGATIVE mg/dL
Leukocytes, UA: NEGATIVE
Nitrite: NEGATIVE
PROTEIN: NEGATIVE mg/dL
Specific Gravity, Urine: 1.037 — ABNORMAL HIGH (ref 1.005–1.030)
pH: 7.5 (ref 5.0–8.0)

## 2016-07-03 LAB — D-DIMER, QUANTITATIVE: D-Dimer, Quant: 0.83 mcg/mL FEU — ABNORMAL HIGH (ref <0.50)

## 2016-07-03 LAB — TSH: TSH: 0.86 mIU/L (ref 0.40–4.50)

## 2016-07-03 MED ORDER — ACETAMINOPHEN 325 MG PO TABS
650.0000 mg | ORAL_TABLET | Freq: Once | ORAL | Status: AC | PRN
  Administered 2016-07-03: 650 mg via ORAL
  Filled 2016-07-03: qty 2

## 2016-07-03 MED ORDER — RIVAROXABAN 15 MG PO TABS: 15.0000 mg | ORAL_TABLET | Freq: Two times a day (BID) | ORAL | 0 refills | Status: DC

## 2016-07-03 MED ORDER — CLONAZEPAM 0.5 MG PO TABS: 0.5000 mg | ORAL_TABLET | Freq: Two times a day (BID) | ORAL | 0 refills | Status: DC | PRN

## 2016-07-03 MED ORDER — IOPAMIDOL (ISOVUE-370) INJECTION 76%
100.0000 mL | Freq: Once | INTRAVENOUS | Status: AC | PRN
  Administered 2016-07-03: 100 mL via INTRAVENOUS

## 2016-07-03 NOTE — ED Notes (Signed)
Pt reports having  CTA, [place], EKG today at [facility], all negative. Sts shivering since last night.

## 2016-07-03 NOTE — ED Notes (Signed)
ED Provider at bedside. 

## 2016-07-03 NOTE — Progress Notes (Signed)
HPI: Bobby Petersen is a 46 y.o. male  who presents to [facility] today, 07/03/16,  for chief complaint of:  Chief Complaint  Patient presents with  . Panic Attack    Episode last night, not sure if panic attack, is worried about possible heart issue    Last night had episode of shivering when went up to get to the bathroom. Noticed some numbness/tingling in fingers, hyperventilating. Some increased heart rate but nothing that he would describe as chest pain or palpitations. Resolved on its own and he went to sleep and woke up with significant sweating. Left arm at the time was numb but he thinks this is because he was sleeping on it.   History of depression/anxiety, been on several SSRIs in the past, Wellbutrin caused rash. 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 [place] controlled substance database reviewed is consistent with the patient's history.   History of low back pain, status post lumbar fusion, has left lumbar radiculopathy persistent with numbness in the fourth and fifth toe and lateral calf. No history of DVT.  Recent GI bug - lost about 7 lbs, significant diarrhea. Diarrhea has resolved as of few days ago.     Past medical history, surgical history, social history and family history reviewed.  Patient Active Problem List   Diagnosis Date Noted  . Abnormal weight gain 01/17/2015  . Left lumbar radiculopathy 09/29/2013    Current medication list and allergy/intolerance information reviewed.   Current Outpatient Prescriptions on File Prior to Visit  Medication Sig Dispense Refill  . AMBULATORY NON FORMULARY MEDICATION Needs convertible sit/stand workstation or desk, with associated chair, these are medically necessary. 1 each 0  . gabapentin (NEURONTIN) 300 MG capsule One tab PO qHS for a week, then BID for a week, then TID. May double weekly to a max of 3,600mg /day 180 capsule 3  . Liraglutide -Weight Management (SAXENDA) 18  MG/3ML SOPN Inject 3 mg into the skin daily. 0.6 mg inj subcut daily for 1 week, then incr by 0.6 mg weekly until reaching 3 mg injected subcut daily 5 pen 0   No current facility-administered medications on file prior to visit.    Allergies  Allergen Reactions  . Penicillins     Childhood allergy    Review of Systems:  Constitutional: No recent illness  HEENT: No  headache, no vision change  Cardiac: No  chest pain, No  pressure, No palpitations  Respiratory:  No  shortness of breath.   Gastrointestinal: No  abdominal pain, no change on bowel habits  Musculoskeletal: No new myalgia/arthralgia  Skin: No  Rash  Neurologic: No  weakness, No  Dizziness  Psychiatric: No  concerns with depression, +concerns with anxiety  Exam:  BP (!) 97/58   Pulse 77   Ht 6\' 5"  (1.956 m)   Wt 237 lb (107.5 kg)   BMI 28.10 kg/m  Recheck later in the day showed improvement: 119/78  Constitutional: VS see above. General Appearance: alert, well-developed, well-nourished, NAD  Eyes: Normal lids and conjunctive, non-icteric sclera  Ears, Nose, Mouth, Throat: MMM, Normal external inspection ears/nares/mouth/lips/gums.  Neck: No masses, trachea midline.   Respiratory: Normal respiratory effort. no wheeze, no rhonchi, no rales  Cardiovascular: S1/S2 normal, no murmur, no rub/gallop auscultated. RRR. No lower extremity edema. Negative Homans sign bilaterally  Musculoskeletal: Gait normal. Symmetric and independent movement of all extremities  Neurological: Normal balance/coordination. No tremor. Diminished sensation left lateral leg, patient states chronic.  Skin: warm, dry, intact.   Psychiatric: Normal judgment/insight. Normal mood and affect. Oriented x3.     GAD 7 : Generalized Anxiety Score 07/03/2016  Nervous, Anxious, on Edge 2  Control/stop worrying 3  Worry too much - different things 3  Trouble relaxing 2  Restless 1  Easily annoyed or irritable 1  Afraid - awful might  happen 2  Total GAD 7 Score 14    Depression screen PHQ 2/9 07/03/2016  Decreased Interest 0  Down, Depressed, Hopeless 1  PHQ - 2 Score 1  Altered sleeping 0  Tired, decreased energy 1  Change in appetite 2  Feeling bad or failure about yourself  0  Trouble concentrating 1  Moving slowly or fidgety/restless 0  PHQ-9 Score 5   Results for orders placed or performed in visit on 07/03/16 (from the past 72 hour(s))  D-Dimer, Quantitative     Status: Abnormal   Collection Time: 07/03/16 10:07 AM  Result Value Ref Range   D-Dimer, Quant 0.83 (H) <0.50 mcg/mL FEU    Comment:   The D-Dimer test is used frequently to exclude an acute PE or DVT.  In patients with a low to moderate clinical risk assessment and a D-Dimer result <0.50 mcg/mL FEU, the likelihood of a PE or DVT is very low.  However, a thromboembolic event should not be excluded solely on the basis of the D-Dimer level.  Increased levels of D-Dimer are associated with a PE, DVT, DIC, malignancies, inflammation, sepsis, surgery, trauma, pregnancy, and advancing patient age. [Jama 2006 11:295(2): 199-207]   For additional information, please refer to: http://education.quest[facility].com/faq/FAQ149 (This link is being provided for information/ educational purposes only)       Ct Angio Chest Pe W Or Wo Contrast  Result Date: 07/03/2016 CLINICAL DATA:  Elevated D-dimer EXAM: CT ANGIOGRAPHY CHEST WITH CONTRAST TECHNIQUE: Multidetector CT imaging of the chest was performed using the standard protocol during bolus administration of intravenous contrast. Multiplanar CT image reconstructions and MIPs were obtained to evaluate the vascular anatomy. CONTRAST:  100 mL Isovue 370 intravenous COMPARISON:  Radiograph 05/08/2015 FINDINGS: Cardiovascular: Satisfactory opacification of the pulmonary arteries to the segmental level. No evidence of pulmonary embolism. Normal heart size. No pericardial effusion. Non aneurysmal aorta. No  dissection. Mediastinum/Nodes: No enlarged mediastinal, hilar, or axillary lymph nodes. Thyroid gland, trachea, and esophagus demonstrate no significant findings. Lungs/Pleura: Lungs are clear. No pleural effusion or pneumothorax. Upper Abdomen: No acute abnormality. Musculoskeletal: No chest wall abnormality. No acute or significant osseous findings. Review of the MIP images confirms the above findings. IMPRESSION: Negative for acute pulmonary embolus or aortic dissection. Clear lung fields. These results will be called to the ordering clinician or representative by the Radiologist Assistant, and communication documented in the PACS or zVision Dashboard. Electronically Signed   By: Jasmine Pang M.D.   On: 07/03/2016 15:50   US Venous Img Lower Bilateral  Result Date: 07/03/2016 CLINICAL DATA:  Positive D-dimer, chronic left calf pain EXAM: BILATERAL LOWER EXTREMITY VENOUS DOPPLER ULTRASOUND TECHNIQUE: Gray-scale sonography with graded compression, as well as color Doppler and duplex ultrasound were performed to evaluate the lower extremity deep venous systems from the level of the common femoral vein and including the common femoral, femoral, profunda femoral, popliteal and calf veins including the posterior tibial, peroneal and gastrocnemius veins when visible. The superficial great saphenous vein was also interrogated. Spectral Doppler was utilized to evaluate flow at rest and with distal augmentation maneuvers in the common femoral, femoral and popliteal  veins. COMPARISON:  None. FINDINGS: RIGHT LOWER EXTREMITY Common Femoral Vein: No evidence of thrombus. Normal compressibility, respiratory phasicity and response to augmentation. Saphenofemoral Junction: No evidence of thrombus. Normal compressibility and flow on color Doppler imaging. Profunda Femoral Vein: No evidence of thrombus. Normal compressibility and flow on color Doppler imaging. Femoral Vein: No evidence of thrombus. Normal compressibility,  respiratory phasicity and response to augmentation. Popliteal Vein: No evidence of thrombus. Normal compressibility, respiratory phasicity and response to augmentation. Calf Veins: No evidence of thrombus. Normal compressibility and flow on color Doppler imaging. Superficial Great Saphenous Vein: No evidence of thrombus. Normal compressibility and flow on color Doppler imaging. Venous Reflux:  None. Other Findings:  None. LEFT LOWER EXTREMITY Common Femoral Vein: No evidence of thrombus. Normal compressibility, respiratory phasicity and response to augmentation. Saphenofemoral Junction: No evidence of thrombus. Normal compressibility and flow on color Doppler imaging. Profunda Femoral Vein: No evidence of thrombus. Normal compressibility and flow on color Doppler imaging. Femoral Vein: No evidence of thrombus. Normal compressibility, respiratory phasicity and response to augmentation. Popliteal Vein: No evidence of thrombus. Normal compressibility, respiratory phasicity and response to augmentation. Calf Veins: No evidence of thrombus. Normal compressibility and flow on color Doppler imaging. Superficial Great Saphenous Vein: No evidence of thrombus. Normal compressibility and flow on color Doppler imaging. Venous Reflux:  None. Other Findings:  None. IMPRESSION: No evidence of deep venous thrombosis. Electronically Signed   By: Judie Petit.  Shick M.D.   On: 07/03/2016 16:17     ASSESSMENT/PLAN:   Episode of shaking - Episode sounds like possible panic attack vs viral prodrome, less likely cardiac but pt is concerned about his risks so stress test is reasonable, await labs - Plan: CBC, COMPLETE METABOLIC PANEL WITH GFR, Magnesium, TSH, Urinalysis  Chest discomfort - EKG: rate 74, sinus rhythm, no ST/T changes concerning for acute ischemia/infarct, (+)artifact, (+)T inversion III, no data V2. CTA negative - Plan: CBC, COMPLETE METABOLIC PANEL WITH GFR, Magnesium, TSH, Urinalysis, Exercise Tolerance Test, EKG 12-Lead,  CT ANGIO CHEST PE W OR WO CONTRAST, US Venous Img Lower Bilateral  Pain of left calf - Low risk according to Wells Criteria for DVT or PE but given BP and episode last night plus recent flight will get D-Dimer.  - Plan: D-Dimer, Quantitative, US Venous Img Lower Bilateral  History of panic attacks - Clonazepam for sparing use. Consider SSRI or alternative but pt has hx intolerance to these meds. Consider counseling.   Low blood pressure reading - likely dehydration, pt not c/o dizziness/weakness. Will come back later today for BP recheck after po fluid intake. BP improved at that time  - Plan: CT ANGIO CHEST PE W OR WO CONTRAST, US Venous Img Lower Bilateral  Positive D-dimer - Given calf symptoms and pt concern for cardiac issue, plus low BP, not unreasonable to r/o DVT/PE. Xarelto Rx written for pt in case (+)clot - Plan: CT ANGIO CHEST PE W OR WO CONTRAST, US Venous Img Lower Bilateral    Follow-up plan: Return in about 2 weeks (around 07/17/2016) for establish care, recheck symptoms/panic .  Visit summary with medication list and pertinent instructions was printed for patient to review, alert [place] if any changes needed. All questions at time of visit were answered - patient instructed to contact office with any additional concerns. ER/RTC precautions were reviewed with the patient and understanding verbalized.   Note: Total time spent 40 minutes, greater than 50% of the visit was spent face-to-face counseling and coordinating care for the following:  The primary encounter diagnosis was Shivering. Diagnoses of Chest discomfort, History of panic attacks, Pain of left calf, and Low blood pressure reading were also pertinent to this visit.Marland Kitchen

## 2016-07-03 NOTE — ED Provider Notes (Signed)
[facility] Provider Note   CSN: [id number] Arrival date & time: 07/03/16  4098 By signing my name below, I, Levon Hedger, attest that this documentation has been prepared under the direction and in the presence of Geoffery Lyons, MD . Electronically Signed: Levon Hedger, Scribe. 07/03/2016. 7:25 PM.   History   Chief Complaint Chief Complaint  Patient presents with  . Fever   HPI Bobby Petersen is a 46 y.o. male who presents to the Emergency Department complaining of gradually worsening, constant fever (tmax 103) onset today. Pt states he woke up this morning with chills, palpitations and general malaise. He was seen at Urgent Care this morning and had a positive D-dimer. He then had a negative CTA and doppler U/S and was instructed follow up as needed. Per pt, his temperature was not checked while at Urgent Care. He also reports associated headache,  sinus pain, and fatigue. No alleviating or modifying factors noted.  No OTC treatments tried for these symptoms PTA. Pt states he travels frequently for his job and flew back from [place] yesterday.  No sick contacts at home. He denies any vomiting, diarrhea, dysuria, hematuria, back pain, or abdominal pain.   The history is provided by the patient. No language interpreter was used.    Past Medical History:  Diagnosis Date  . Complication of anesthesia    had 2 surgery closed together and stayed drunk foer a bit  . Constipation   . Pneumonia    as a teenager    Patient Active Problem List   Diagnosis Date Noted  . Abnormal weight gain 01/17/2015  . Left lumbar radiculopathy 09/29/2013    Past Surgical History:  Procedure Laterality Date  . HERNIA REPAIR Left   . LUMBAR LAMINECTOMY/DECOMPRESSION MICRODISCECTOMY Left 05/08/2015   Procedure: LUMBAR LAMINECTOMY/DECOMPRESSION MICRODISCECTOMY;  Surgeon: Estill Bamberg, MD;  Location: [facility] OR;  Service: Orthopedics;  Laterality: Left;  Left sided lumbar 4-5 microdisectomy  .  NASAL POLYP SURGERY    . NASAL SEPTUM SURGERY    . WISDOM TOOTH EXTRACTION         Home Medications    Prior to Admission medications   Medication Sig Start Date End Date Taking? Authorizing Provider  AMBULATORY NON FORMULARY MEDICATION Needs convertible sit/stand workstation or desk, with associated chair, these are medically necessary. 03/15/15   Monica Becton, MD  clonazePAM (KLONOPIN) 0.5 MG tablet Take 1-2 tablets (0.5-1 mg total) by mouth 2 (two) times daily as needed for anxiety (panic). Use sparingly as needed to avoid dependence/tolerance 07/03/16   Sunnie Nielsen, DO  Rivaroxaban (XARELTO) 15 MG TABS tablet Take 1 tablet (15 mg total) by mouth 2 (two) times daily with a meal. 07/03/16 07/24/16  Sunnie Nielsen, DO    Family History History reviewed. No pertinent family history.  Social History Social History  Substance Use Topics  . Smoking status: Former Games developer  . Smokeless tobacco: Never Used     Comment: off and on for  years  . Alcohol use No     Allergies   Penicillins   Review of Systems Review of Systems  Constitutional: Positive for chills, fatigue and fever.  HENT: Positive for sinus pain.   Cardiovascular: Positive for palpitations.  Gastrointestinal: Negative for abdominal pain, diarrhea, nausea and vomiting.  Musculoskeletal: Negative for back pain.  Neurological: Positive for headaches.  All other systems reviewed and are negative.  Physical Exam Updated Vital Signs BP 134/68 (BP Location: Right Arm)   Pulse 86  Temp (!) 102.6 F (39.2 C) (Oral)   Resp 18   SpO2 100%   Physical Exam  Constitutional: He is oriented to person, place, and time. He appears well-developed and well-nourished.  HENT:  Head: Normocephalic and atraumatic.  Eyes: EOM are normal.  Neck: Normal range of motion.  Cardiovascular: Normal rate, regular rhythm, normal heart sounds and intact distal pulses.   Pulmonary/Chest: Effort normal and breath sounds  normal. No respiratory distress.  Abdominal: Soft. He exhibits no distension. There is no tenderness.  Musculoskeletal: Normal range of motion.  Neurological: He is alert and oriented to person, place, and time.  Skin: Skin is warm and dry.  Psychiatric: He has a normal mood and affect. Judgment normal.  Nursing note and vitals reviewed.   ED Treatments / Results  DIAGNOSTIC STUDIES:  Oxygen Saturation is 100% on RA, normal by my interpretation.    COORDINATION OF CARE:  7:22 PM Will order blood culture, CBC, and UA. Discussed treatment plan with pt at bedside and pt agreed to plan.   Labs (all labs ordered are listed, but only abnormal results are displayed) Labs Reviewed - No data to display  EKG  EKG Interpretation None       Radiology Ct Angio Chest Pe W Or Wo Contrast  Result Date: 07/03/2016 CLINICAL DATA:  Elevated D-dimer EXAM: CT ANGIOGRAPHY CHEST WITH CONTRAST TECHNIQUE: Multidetector CT imaging of the chest was performed using the standard protocol during bolus administration of intravenous contrast. Multiplanar CT image reconstructions and MIPs were obtained to evaluate the vascular anatomy. CONTRAST:  100 mL Isovue 370 intravenous COMPARISON:  Radiograph 05/08/2015 FINDINGS: Cardiovascular: Satisfactory opacification of the pulmonary arteries to the segmental level. No evidence of pulmonary embolism. Normal heart size. No pericardial effusion. Non aneurysmal aorta. No dissection. Mediastinum/Nodes: No enlarged mediastinal, hilar, or axillary lymph nodes. Thyroid gland, trachea, and esophagus demonstrate no significant findings. Lungs/Pleura: Lungs are clear. No pleural effusion or pneumothorax. Upper Abdomen: No acute abnormality. Musculoskeletal: No chest wall abnormality. No acute or significant osseous findings. Review of the MIP images confirms the above findings. IMPRESSION: Negative for acute pulmonary embolus or aortic dissection. Clear lung fields. These results  will be called to the ordering clinician or representative by the Radiologist Assistant, and communication documented in the PACS or zVision Dashboard. Electronically Signed   By: Jasmine Pang M.D.   On: 07/03/2016 15:50   US Venous Img Lower Bilateral  Result Date: 07/03/2016 CLINICAL DATA:  Positive D-dimer, chronic left calf pain EXAM: BILATERAL LOWER EXTREMITY VENOUS DOPPLER ULTRASOUND TECHNIQUE: Gray-scale sonography with graded compression, as well as color Doppler and duplex ultrasound were performed to evaluate the lower extremity deep venous systems from the level of the common femoral vein and including the common femoral, femoral, profunda femoral, popliteal and calf veins including the posterior tibial, peroneal and gastrocnemius veins when visible. The superficial great saphenous vein was also interrogated. Spectral Doppler was utilized to evaluate flow at rest and with distal augmentation maneuvers in the common femoral, femoral and popliteal veins. COMPARISON:  None. FINDINGS: RIGHT LOWER EXTREMITY Common Femoral Vein: No evidence of thrombus. Normal compressibility, respiratory phasicity and response to augmentation. Saphenofemoral Junction: No evidence of thrombus. Normal compressibility and flow on color Doppler imaging. Profunda Femoral Vein: No evidence of thrombus. Normal compressibility and flow on color Doppler imaging. Femoral Vein: No evidence of thrombus. Normal compressibility, respiratory phasicity and response to augmentation. Popliteal Vein: No evidence of thrombus. Normal compressibility, respiratory phasicity and response to augmentation.  Calf Veins: No evidence of thrombus. Normal compressibility and flow on color Doppler imaging. Superficial Great Saphenous Vein: No evidence of thrombus. Normal compressibility and flow on color Doppler imaging. Venous Reflux:  None. Other Findings:  None. LEFT LOWER EXTREMITY Common Femoral Vein: No evidence of thrombus. Normal compressibility,  respiratory phasicity and response to augmentation. Saphenofemoral Junction: No evidence of thrombus. Normal compressibility and flow on color Doppler imaging. Profunda Femoral Vein: No evidence of thrombus. Normal compressibility and flow on color Doppler imaging. Femoral Vein: No evidence of thrombus. Normal compressibility, respiratory phasicity and response to augmentation. Popliteal Vein: No evidence of thrombus. Normal compressibility, respiratory phasicity and response to augmentation. Calf Veins: No evidence of thrombus. Normal compressibility and flow on color Doppler imaging. Superficial Great Saphenous Vein: No evidence of thrombus. Normal compressibility and flow on color Doppler imaging. Venous Reflux:  None. Other Findings:  None. IMPRESSION: No evidence of deep venous thrombosis. Electronically Signed   By: Judie Petit.  Shick M.D.   On: 07/03/2016 16:17   Procedures Procedures (including critical care time)  Medications Ordered in ED Medications  acetaminophen (TYLENOL) tablet 650 mg (650 mg Oral Given 07/03/16 1847)    Initial Impression / Assessment and Plan / ED Course  I have reviewed the triage vital signs and the nursing notes.  Pertinent labs & imaging results that were available during my care of the patient were reviewed by me and considered in my medical decision making (see chart for details).  Patient presents here with complaints of fever and shaking chills. He was seen earlier today and ruled out for pulmonary embolism as he has recently traveled. This evening he noticed a fever of 103 and presents for evaluation of this. He describes no cough, dysuria, abdominal pain, vomiting, diarrhea, sore throat, or other symptoms that would explain this.  Laboratory studies are unremarkable. He has no elevation of white count. Blood cultures were obtained and are pending. I suspect some sort of viral syndrome. I have advised him to take Tylenol and ibuprofen as needed for fever. He is to  return to the ER if his symptoms worsen or change. If his blood cultures require further attention, he will be notified of this and recommendations will be made at that time.  Final Clinical Impressions(s) / ED Diagnoses   Final diagnoses:  None    New Prescriptions New Prescriptions   No medications on file  I personally performed the services described in this documentation, which was scribed in my presence. The recorded information has been reviewed and is accurate.        Geoffery Lyons, MD 07/03/16 [phone]

## 2016-07-03 NOTE — Telephone Encounter (Signed)
Pt here to have a BP check prior to CT at 3pm  and [place] at 3:30.  Bp is 119/78, O2 100, P 85.

## 2016-07-03 NOTE — Discharge Instructions (Signed)
Tylenol 1000 mg rotated with ibuprofen 600 mg every 4 hours as needed for fever.  Drink plenty of fluids and get plenty of rest.  We will call you if your cultures indicate you require further treatment or action.

## 2016-07-03 NOTE — Telephone Encounter (Signed)
No problem.

## 2016-07-03 NOTE — Telephone Encounter (Signed)
I saw, no one called me! Thanks for handling this

## 2016-07-03 NOTE — ED Triage Notes (Addendum)
Patient has had heart pain and chest pain and left leg pain. Was seen at urgent care and sent here for CT and U/S both negative - Now the patient is having a fever and headache.

## 2016-07-03 NOTE — Telephone Encounter (Signed)
Called by imaging: negative CTA and doppler U/S. Pt was instructed NOT to take xarelto. Follow up as needed.

## 2016-07-04 LAB — URINALYSIS
BILIRUBIN URINE: NEGATIVE
GLUCOSE, UA: NEGATIVE
Hgb urine dipstick: NEGATIVE
KETONES UR: NEGATIVE
Leukocytes, UA: NEGATIVE
Nitrite: NEGATIVE
PROTEIN: NEGATIVE
Specific Gravity, Urine: 1.024 (ref 1.001–1.035)
pH: 7 (ref 5.0–8.0)

## 2016-07-04 LAB — MAGNESIUM: MAGNESIUM: 1.9 mg/dL (ref 1.5–2.5)

## 2016-07-07 ENCOUNTER — Telehealth (HOSPITAL_COMMUNITY): Payer: Self-pay

## 2016-07-07 NOTE — Telephone Encounter (Signed)
Encounter complete. 

## 2016-07-08 ENCOUNTER — Ambulatory Visit (HOSPITAL_COMMUNITY)
Admission: RE | Admit: 2016-07-08 | Discharge: 2016-07-08 | Disposition: A | Discharge status: Home / Self Care | Payer: 59 | Source: Ambulatory Visit | Attending: Cardiology | Admitting: Cardiology

## 2016-07-08 DIAGNOSIS — R0789 Other chest pain: Secondary | ICD-10-CM | POA: Insufficient documentation

## 2016-07-09 DIAGNOSIS — R0789 Other chest pain: Secondary | ICD-10-CM | POA: Insufficient documentation

## 2016-07-09 DIAGNOSIS — R251 Tremor, unspecified: Secondary | ICD-10-CM | POA: Insufficient documentation

## 2016-07-09 DIAGNOSIS — Z8659 Personal history of other mental and behavioral disorders: Secondary | ICD-10-CM | POA: Insufficient documentation

## 2016-07-09 DIAGNOSIS — M79662 Pain in left lower leg: Secondary | ICD-10-CM | POA: Insufficient documentation

## 2016-07-09 DIAGNOSIS — R7989 Other specified abnormal findings of blood chemistry: Secondary | ICD-10-CM | POA: Insufficient documentation

## 2016-07-09 LAB — EXERCISE TOLERANCE TEST
CHL CUP RESTING HR STRESS: 73 {beats}/min
CSEPPHR: 169 {beats}/min
Estimated workload: 13.4 METS
Exercise duration (min): 11 min
Exercise duration (sec): 1 s
MPHR: 174 {beats}/min
Percent HR: 97 %
RPE: 17

## 2016-07-09 LAB — CULTURE, BLOOD (ROUTINE X 2)
CULTURE: NO GROWTH
CULTURE: NO GROWTH

## 2016-07-23 ENCOUNTER — Encounter: Payer: Self-pay | Admitting: Osteopathic Medicine

## 2016-07-23 ENCOUNTER — Ambulatory Visit (INDEPENDENT_AMBULATORY_CARE_PROVIDER_SITE_OTHER): Payer: 59 | Admitting: Osteopathic Medicine

## 2016-07-23 VITALS — BP 114/68 | HR 80 | Ht 72.0 in | Wt 233.0 lb

## 2016-07-23 DIAGNOSIS — R251 Tremor, unspecified: Secondary | ICD-10-CM | POA: Diagnosis not present

## 2016-07-23 DIAGNOSIS — Z7189 Other specified counseling: Secondary | ICD-10-CM | POA: Diagnosis not present

## 2016-07-23 DIAGNOSIS — Z8659 Personal history of other mental and behavioral disorders: Secondary | ICD-10-CM | POA: Diagnosis not present

## 2016-07-23 DIAGNOSIS — R0789 Other chest pain: Secondary | ICD-10-CM

## 2016-07-23 DIAGNOSIS — Z712 Person consulting for explanation of examination or test findings: Secondary | ICD-10-CM

## 2016-07-23 NOTE — Progress Notes (Signed)
HPI: Bobby Petersen is a 46 y.o. male  who presents to [facility] today, 07/23/16,  for chief complaint of:  Chief Complaint  Patient presents with  . Establish Care  . discuss lab results    Patient seen 2 weeks ago in clinic, concerns for episode of shingles of breath/chest pain, we panic attack however d-dimer was positive and blood pressure was on the low side. Imaging for DVT/PE was negative. Patient also ended up going to the ER later that evening for concerning flulike illness and high fever. Blood cultures were negative. Patient states illness has resolved. Still suffers from some anxiety/panic issues but rarely. Has not taken any of the clonazepam as of yet. Overall feeling much better knowing that imaging and stress test was negative.  Past medical, surgical, social and family history reviewed: Patient Active Problem List   Diagnosis Date Noted  . Episode of shaking 07/09/2016  . Chest discomfort 07/09/2016  . Pain of left calf 07/09/2016  . History of panic attacks 07/09/2016  . Positive D-dimer 07/09/2016  . Abnormal weight gain 01/17/2015  . Left lumbar radiculopathy 09/29/2013   Past Surgical History:  Procedure Laterality Date  . HERNIA REPAIR Left   . LUMBAR LAMINECTOMY/DECOMPRESSION MICRODISCECTOMY Left 05/08/2015   Procedure: LUMBAR LAMINECTOMY/DECOMPRESSION MICRODISCECTOMY;  Surgeon: Estill Bamberg, MD;  Location: [facility] OR;  Service: Orthopedics;  Laterality: Left;  Left sided lumbar 4-5 microdisectomy  . NASAL POLYP SURGERY    . NASAL SEPTUM SURGERY    . WISDOM TOOTH EXTRACTION     Social History  Substance Use Topics  . Smoking status: Former Games developer  . Smokeless tobacco: Never Used     Comment: off and on for  years  . Alcohol use No   No family history on file.   Current medication list and allergy/intolerance information reviewed:   Current Outpatient Prescriptions  Medication Sig Dispense Refill  . AMBULATORY NON  FORMULARY MEDICATION Needs convertible sit/stand workstation or desk, with associated chair, these are medically necessary. 1 each 0  . clonazePAM (KLONOPIN) 0.5 MG tablet Take 1-2 tablets (0.5-1 mg total) by mouth 2 (two) times daily as needed for anxiety (panic). Use sparingly as needed to avoid dependence/tolerance 15 tablet 0  . Rivaroxaban (XARELTO) 15 MG TABS tablet Take 1 tablet (15 mg total) by mouth 2 (two) times daily with a meal. 42 tablet 0   No current facility-administered medications for this visit.    Allergies  Allergen Reactions  . Penicillins     Childhood allergy      Review of Systems:  Constitutional:  No  fever, no chills, No recent illness, No unintentional weight changes. No significant fatigue.   HEENT: No  headache, no vision change  Cardiac: No  chest pain, No  pressure, No palpitations  Respiratory:  No  shortness of breath. No  Cough  Gastrointestinal: No  abdominal pain, No  nausea  Musculoskeletal: No new myalgia/arthralgia  Neurologic: No  weakness, No  dizziness  Psychiatric: No  concerns with depression, +concerns with anxiety, No sleep problems, No mood problems  Exam:  BP 114/68   Pulse 80   Ht 6' (1.829 m)   Wt 233 lb (105.7 kg)   BMI 31.60 kg/m   Constitutional: VS see above. General Appearance: alert, well-developed, well-nourished, NAD  Eyes: Normal lids and conjunctive, non-icteric sclera  Ears, Nose, Mouth, Throat: MMM, Normal external inspection ears/nares/mouth/lips/gums.  Neck: No masses, trachea midline.  Respiratory: Normal respiratory effort. no  wheeze, no rhonchi, no rales  Cardiovascular: S1/S2 normal, no murmur, no rub/gallop auscultated. RRR. No lower extremity edema.   Neurological: Normal balance/coordination. No tremor.  Skin: warm, dry, intact  Psychiatric: Normal judgment/insight. Normal mood and affect. Oriented x3.    ASSESSMENT/PLAN:   Negative workup with the exception of hypertensive response to  exercise. This does confer some increased cardiac risk long-term, patient is advised to improve low carbohydrate/low fat diet implement gradually increasing intensity of cardiovascular/aerobic exercise and precautions were reviewed with particular regard to chest pain/shortness of breath  Encounter to discuss test results - No alarming results for labs, CT PE, ultrasound for DVT, cardiac stress test  History of panic attacks - Sparing use of clonazepam advised  Chest discomfort - Resolved, no concerns on stress test for ischemia  Episode of shaking - Resolved, questionable due to viral illness versus panic    Visit summary with medication list and pertinent instructions was printed for patient to review. All questions at time of visit were answered - patient instructed to contact office with any additional concerns. ER/RTC precautions were reviewed with the patient. Follow-up plan: Return for annual physical when due - sooner if needed .  Note: Total time spent 15 minutes, greater than 50% of the visit was spent face-to-face counseling and coordinating care for the following: The primary encounter diagnosis was History of panic attacks. A diagnosis of Chest discomfort was also pertinent to this visit.Marland Kitchen

## 2016-09-08 ENCOUNTER — Emergency Department (INDEPENDENT_AMBULATORY_CARE_PROVIDER_SITE_OTHER)
Admission: EM | Admit: 2016-09-08 | Discharge: 2016-09-08 | Disposition: A | Discharge status: Home / Self Care | Payer: 59 | Source: Home / Self Care | Attending: Family Medicine | Admitting: Family Medicine

## 2016-09-08 ENCOUNTER — Encounter: Payer: Self-pay | Admitting: *Deleted

## 2016-09-08 DIAGNOSIS — L03811 Cellulitis of head [any part, except face]: Secondary | ICD-10-CM

## 2016-09-08 MED ORDER — MUPIROCIN 2 % EX OINT: TOPICAL_OINTMENT | CUTANEOUS | 0 refills | Status: DC

## 2016-09-08 MED ORDER — DOXYCYCLINE HYCLATE 100 MG PO CAPS: 100.0000 mg | ORAL_CAPSULE | Freq: Two times a day (BID) | ORAL | 0 refills | Status: DC

## 2016-09-08 NOTE — ED Triage Notes (Signed)
Patient c/o sore behind right ear x 1 week. He drained pus from it a few days ago. C/o pain. Taking Advil otc.

## 2016-09-08 NOTE — ED Provider Notes (Signed)
CSN: [id number]     Arrival date & time 09/08/16  0805 History   First MD Initiated Contact with Patient 09/08/16 [phone]     Chief Complaint  Patient presents with  . Sore    behind right ear   (Consider location/radiation/quality/duration/timing/severity/associated sxs/prior Treatment) HPI  Bobby Petersen is a 46 y.o. male presenting to UC with c/o gradually worsening sore behind his Right ear that started about 1 week ago.  Pt notes his wife described it initially as ingrown hairs.  They have been able to squeeze the area and get some pus out but it only improved for about 1 day, then started to worsen again. Pain is aching and sore, 5/10, worse with palpation.  Denies fever, chills, n/v/d. Pt notes he is leaving this morning for a business trip to [place] for a few days and wanted to make sure it was not infected and he was not contagious.     Past Medical History:  Diagnosis Date  . Complication of anesthesia    had 2 surgery closed together and stayed drunk foer a bit  . Constipation   . Pneumonia    as a teenager   Past Surgical History:  Procedure Laterality Date  . BACK SURGERY    . HERNIA REPAIR Left   . LUMBAR LAMINECTOMY/DECOMPRESSION MICRODISCECTOMY Left 05/08/2015   Procedure: LUMBAR LAMINECTOMY/DECOMPRESSION MICRODISCECTOMY;  Surgeon: Estill Bamberg, MD;  Location: [facility] OR;  Service: Orthopedics;  Laterality: Left;  Left sided lumbar 4-5 microdisectomy  . NASAL POLYP SURGERY    . NASAL SEPTUM SURGERY    . WISDOM TOOTH EXTRACTION     History reviewed. No pertinent family history. Social History  Substance Use Topics  . Smoking status: Former Games developer  . Smokeless tobacco: Never Used     Comment: off and on for  years  . Alcohol use No    Review of Systems  Constitutional: Negative for chills and fever.  Gastrointestinal: Negative for diarrhea, nausea and vomiting.  Musculoskeletal: Negative for myalgias, neck pain and neck stiffness.  Skin: Positive for color change  and wound. Negative for rash.  Neurological: Negative for dizziness, light-headedness and headaches.    Allergies  Penicillins  Home Medications   Prior to Admission medications   Medication Sig Start Date End Date Taking? Authorizing Provider  cetirizine (ZYRTEC) 1 MG/ML syrup Take by mouth.   Yes [provider]  Fluocinolone Acetonide Body (DERMA-SMOOTHE/FS BODY) 0.01 % OIL Apply topically.   Yes [provider]  AMBULATORY NON FORMULARY MEDICATION Needs convertible sit/stand workstation or desk, with associated chair, these are medically necessary. Patient not taking: Reported on 07/23/2016 03/15/15   Monica Becton, MD  doxycycline (VIBRAMYCIN) 100 MG capsule Take 1 capsule (100 mg total) by mouth 2 (two) times daily. One po bid x 7 days 09/08/16   Junius Finner, PA-C  fluticasone [facility]) 50 MCG/ACT nasal spray Place into the nose.    [provider]  mupirocin ointment (BACTROBAN) 2 % Apply to sore 2-3 times daily for 5 days 09/08/16   Junius Finner, PA-C   Meds Ordered and Administered this Visit  Medications - No data to display  BP 114/79 (BP Location: Left Arm)   Pulse 77   Temp 98.4 F (36.9 C) (Oral)   Wt 230 lb (104.3 kg)   SpO2 96%   BMI 31.19 kg/m  No data found.   Physical Exam  Constitutional: He is oriented to person, place, and time. He appears well-developed and well-nourished.  No distress.  HENT:  Head: Normocephalic.    Behind Right ear on scalp: 0.5cm area of erythema, minimal fluctuance with overlying scab. Tender. No active bleeding or drainage.   Eyes: EOM are normal.  Neck: Normal range of motion.  Cardiovascular: Normal rate.   Pulmonary/Chest: Effort normal.  Musculoskeletal: Normal range of motion.  Neurological: He is alert and oriented to person, place, and time.  Skin: Skin is warm and dry. He is not diaphoretic. There is erythema.  Psychiatric: He has a normal mood and affect. His behavior is normal.    Nursing note and vitals reviewed.   Urgent Care Course     Procedures (including critical care time)  Labs Review Labs Reviewed - No data to display  Imaging Review No results found.   MDM   1. Cellulitis of scalp    Cellulitis behind Right ear on the scalp.    Rx: mupirocin ointment and doxycycline (pt had rash with penicillin as a child).  Home care instructions provided. F/u in 1 week if not improving, sooner if worsening.     Junius Finner, PA-C 09/08/16 [phone]

## 2017-09-06 ENCOUNTER — Encounter: Payer: Self-pay | Admitting: Family Medicine

## 2017-09-06 ENCOUNTER — Emergency Department (INDEPENDENT_AMBULATORY_CARE_PROVIDER_SITE_OTHER)
Admission: EM | Admit: 2017-09-06 | Discharge: 2017-09-06 | Disposition: A | Discharge status: Home / Self Care | Payer: 59 | Source: Home / Self Care

## 2017-09-06 ENCOUNTER — Other Ambulatory Visit: Payer: Self-pay

## 2017-09-06 DIAGNOSIS — J029 Acute pharyngitis, unspecified: Secondary | ICD-10-CM

## 2017-09-06 LAB — POCT RAPID STREP A (OFFICE): Rapid Strep A Screen: NEGATIVE

## 2017-09-06 MED ORDER — CHLORHEXIDINE GLUCONATE 0.12 % MT SOLN: 15.0000 mL | Freq: Two times a day (BID) | OROMUCOSAL | 0 refills | Status: DC

## 2017-09-06 NOTE — ED Triage Notes (Signed)
Pt c/o sore throat and sore tongue x 4 days. He also c/o bilateral ear pain x today. He took Advil this morning.

## 2017-09-06 NOTE — ED Provider Notes (Signed)
[facility]   [id number] 09/06/17 Arrival Time: 1230   SUBJECTIVE:  Bobby Petersen is a 47 y.o. male who presents to the urgent care with complaint of severe sore throat.  He also notes a sore tongue, both of which have been ongoing for 4 days.   [place], patient developed bilateral ear pain for which he took Advil.  Past Medical History:  Diagnosis Date  . Complication of anesthesia    had 2 surgery closed together and stayed drunk foer a bit  . Constipation   . Pneumonia    as a teenager   History reviewed. No pertinent family history. Social History   Socioeconomic History  . Marital status: Married    Spouse name: Not on file  . Number of children: Not on file  . Years of education: Not on file  . Highest education level: Not on file  Occupational History  . Not on file  Social Needs  . Financial resource strain: Not on file  . Food insecurity:    Worry: Not on file    Inability: Not on file  . Transportation needs:    Medical: Not on file    Non-medical: Not on file  Tobacco Use  . Smoking status: Former Games developer  . Smokeless tobacco: Never Used  . Tobacco comment: off and on for  years  Substance and Sexual Activity  . Alcohol use: No  . Drug use: No  . Sexual activity: Not on file  Lifestyle  . Physical activity:    Days per week: Not on file    Minutes per session: Not on file  . Stress: Not on file  Relationships  . Social connections:    Talks on phone: Not on file    Gets together: Not on file    Attends religious service: Not on file    Active member of club or organization: Not on file    Attends meetings of clubs or organizations: Not on file    Relationship status: Not on file  . Intimate partner violence:    Fear of current or ex partner: Not on file    Emotionally abused: Not on file    Physically abused: Not on file    Forced sexual activity: Not on file  Other Topics Concern  . Not on file  Social History Narrative  . Not  on file   Current Meds  Medication Sig  . cetirizine (ZYRTEC) 1 MG/ML syrup Take by mouth.  . fluticasone (FLONASE) 50 MCG/ACT nasal spray Place into the nose.   Allergies  Allergen Reactions  . Penicillins Rash    Childhood allergy      ROS: As per HPI, remainder of ROS negative.   OBJECTIVE:   Vitals:   09/06/17 1245 09/06/17 1246  BP: 107/72   Pulse: 81   Resp: 16   Temp: 98.7 F (37.1 C)   TempSrc: Oral   SpO2: 96%   Weight:  233 lb (105.7 kg)  Height:  6' (1.829 m)     General appearance: alert; no distress Eyes: PERRL; EOMI; conjunctiva normal HENT: normocephalic; atraumatic; TMs normal, canal moderate wax on right, external ears normal without trauma; nasal mucosa normal; oral mucosa reddened posterior mucosa without signifcant swelling or exudates Neck: supple Lungs: clear to auscultation bilaterally Heart: regular rate and rhythm Abdomen: soft, non-tender; bowel sounds normal; no masses or organomegaly; no guarding or rebound tenderness Back: no CVA tenderness Extremities: no cyanosis or edema; symmetrical with no gross  deformities Skin: warm and dry Neurologic: normal gait; grossly normal Psychological: alert and cooperative; normal mood and affect      Labs:  Results for orders placed or performed during the hospital encounter of 09/06/17  POCT rapid strep A  Result Value Ref Range   Rapid Strep A Screen Negative Negative    Labs Reviewed  STREP A DNA PROBE  POCT RAPID STREP A (OFFICE)    No results found.     ASSESSMENT & PLAN:  1. Acute pharyngitis, unspecified etiology   2. Viral pharyngitis   The strep test is negative and the ears, aside from the wax, are normal.  The throat is very red and, all things considered, this appears to be a viral infection   Meds ordered this encounter  Medications  . chlorhexidine (PERIDEX) 0.12 % solution    Sig: Use as directed 15 mLs in the mouth or throat 2 (two) times daily.     Dispense:  120 mL    Refill:  0    Reviewed expectations re: course of current medical issues. Questions answered. Outlined signs and symptoms indicating need for more acute intervention. Patient verbalized understanding. After Visit Summary given.    Procedures:      Elvina Sidle, MD 09/06/17 1311

## 2017-09-06 NOTE — Discharge Instructions (Addendum)
The strep test is negative and the ears, aside from the wax, are normal.  The throat is very red and, all things considered, this appears to be a viral infection

## 2017-09-07 ENCOUNTER — Telehealth: Payer: Self-pay | Admitting: *Deleted

## 2017-09-07 LAB — STREP A DNA PROBE: Group A Strep Probe: NOT DETECTED

## 2017-09-07 NOTE — Telephone Encounter (Signed)
Spoke to pt given Tcx results. He reports some improvement today after using the mouthwash.

## 2017-10-11 IMAGING — CT CT ANGIO CHEST
2 of 8 series · 19 of 36 positions shown · IV contrast (isovue)
Comparison: Radiograph 05/08/2015

CLINICAL DATA: Elevated D-dimer

EXAM:
CT ANGIOGRAPHY CHEST WITH CONTRAST
TECHNIQUE: Multidetector CT imaging of the chest was performed using the
standard protocol during bolus administration of intravenous
contrast. Multiplanar CT image reconstructions and MIPs were
obtained to evaluate the vascular anatomy.
CONTRAST:  100 mL Isovue 370 intravenous

[Series 6: pe thins · axial · 0.71mm/px · z∈[-330,-58]mm · 18 of 304 slices shown]
[im 16/304  lung]
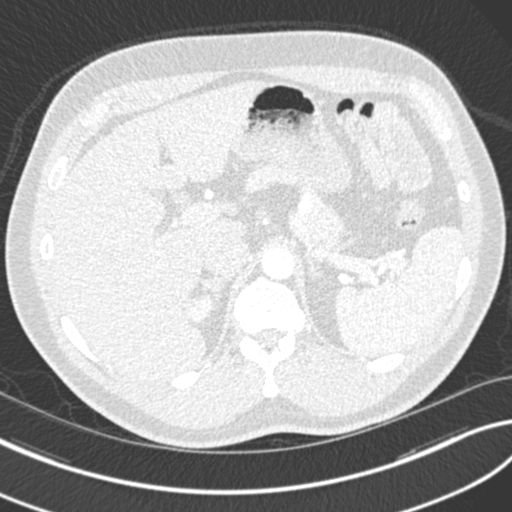
[im 32/304  mediastinal]
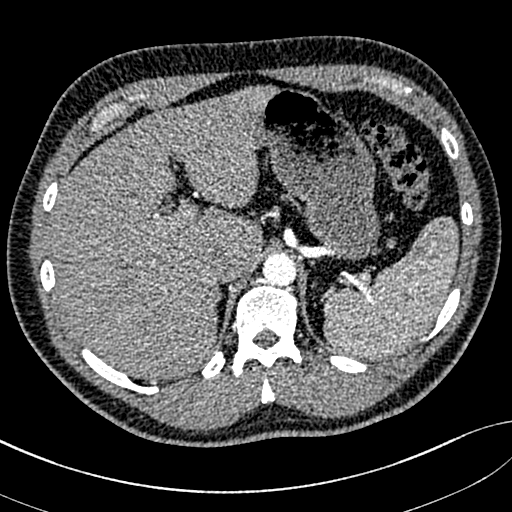
[im 48/304  lung]
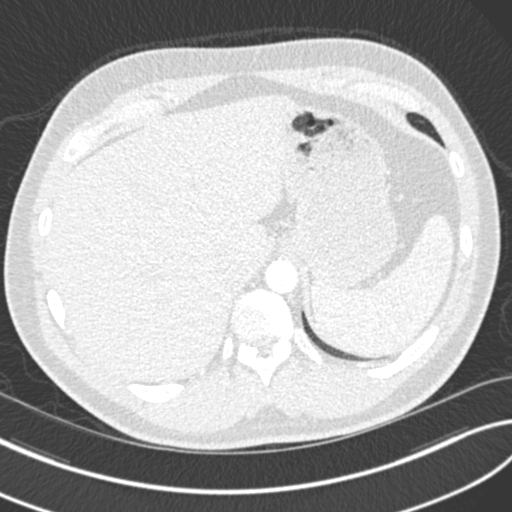
[im 64/304  mediastinal]
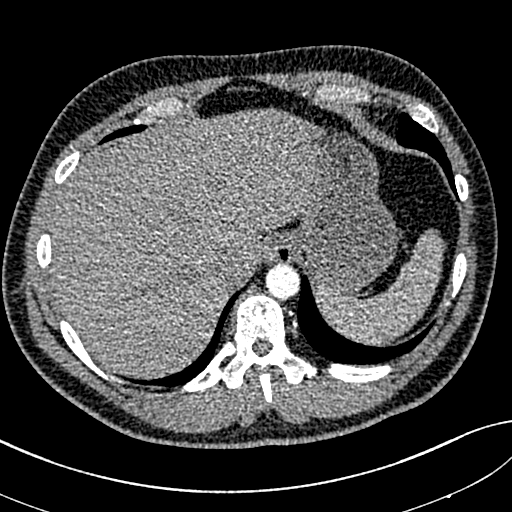
[im 80/304  lung]
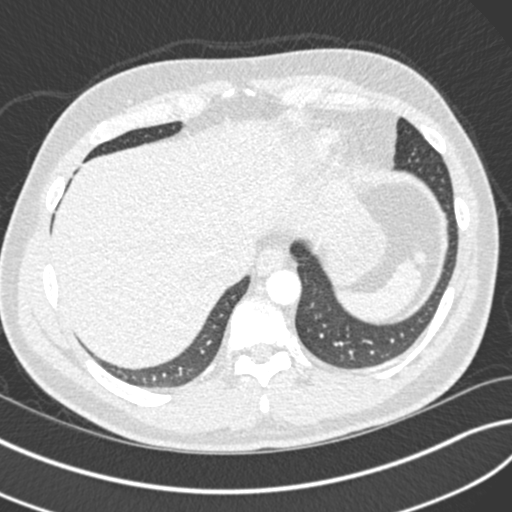
[im 96/304  mediastinal]
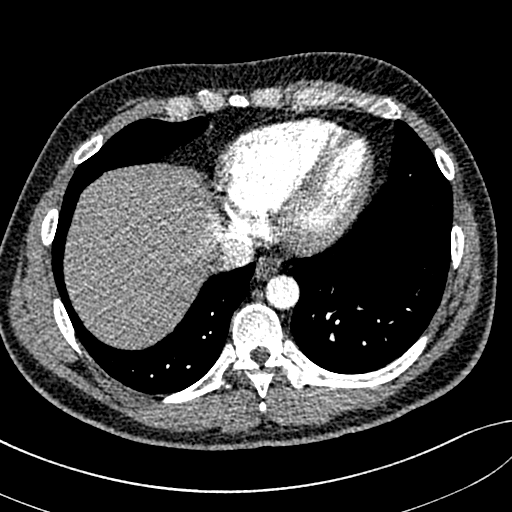
[im 112/304  lung]
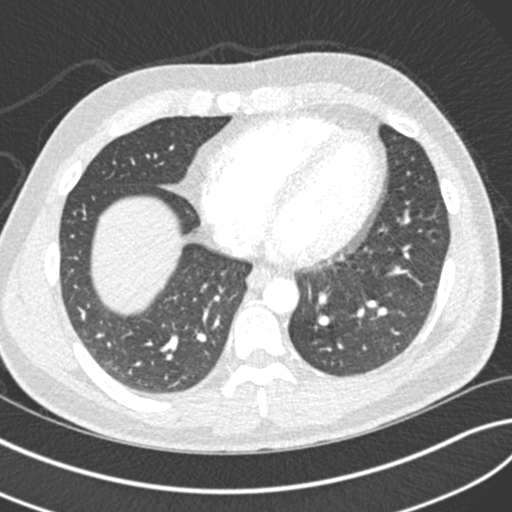
[im 128/304  mediastinal]
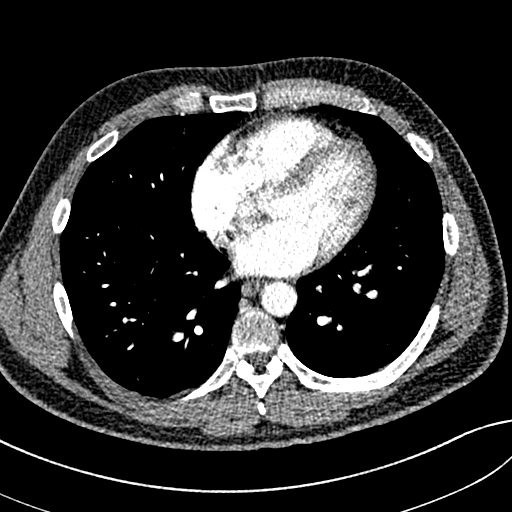
[im 144/304  lung]
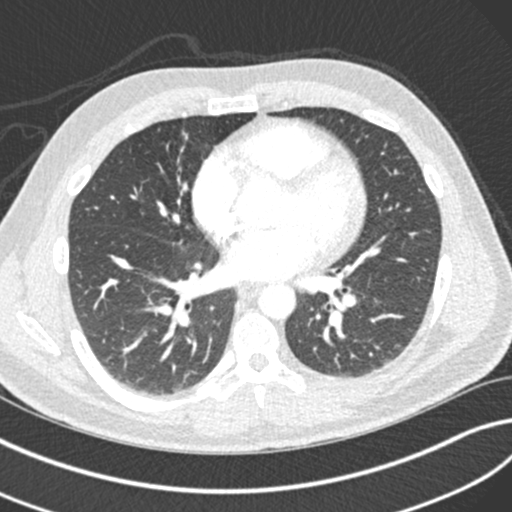
[im 160/304  mediastinal]
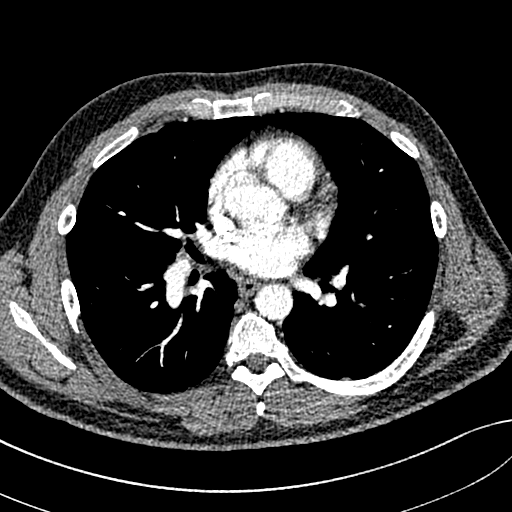
[im 176/304  lung]
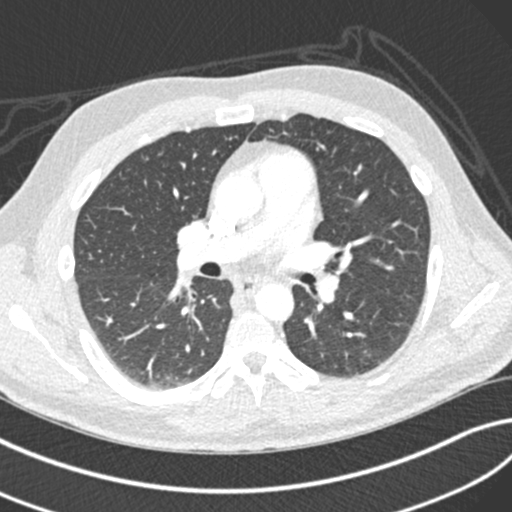
[im 192/304  mediastinal]
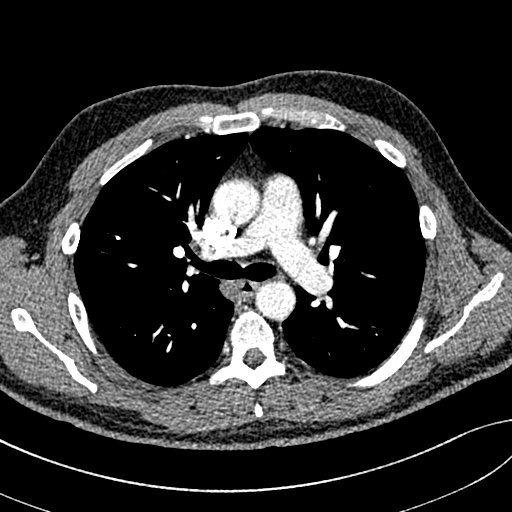
[im 208/304  lung]
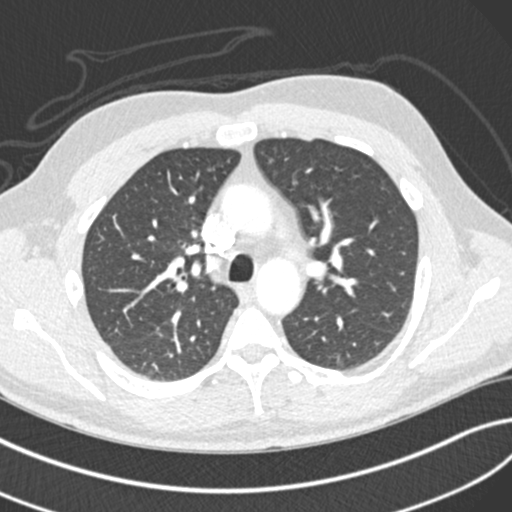
[im 224/304  mediastinal]
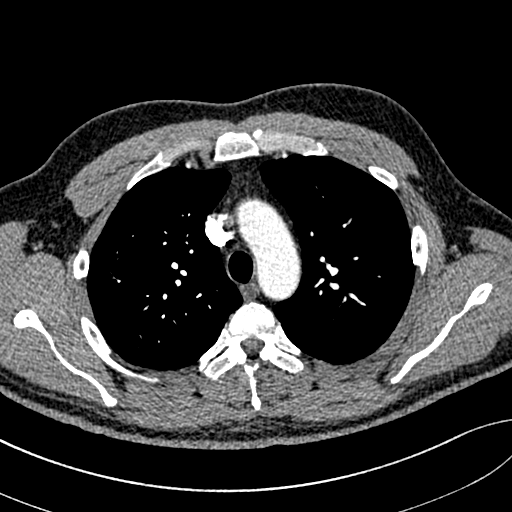
[im 240/304  lung]
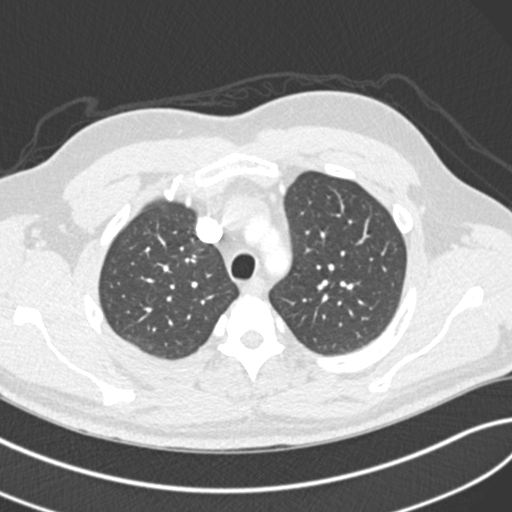
[im 256/304  mediastinal]
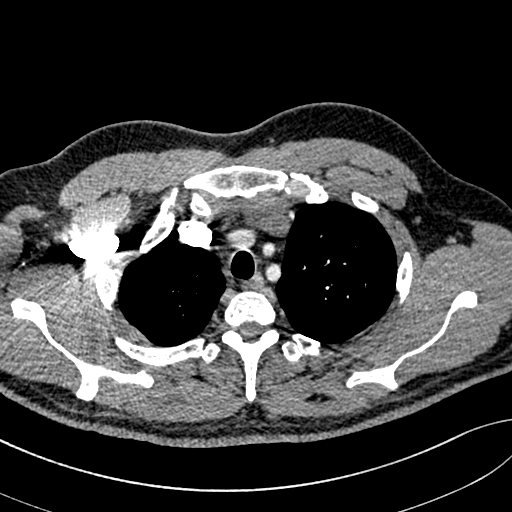
[im 272/304  lung]
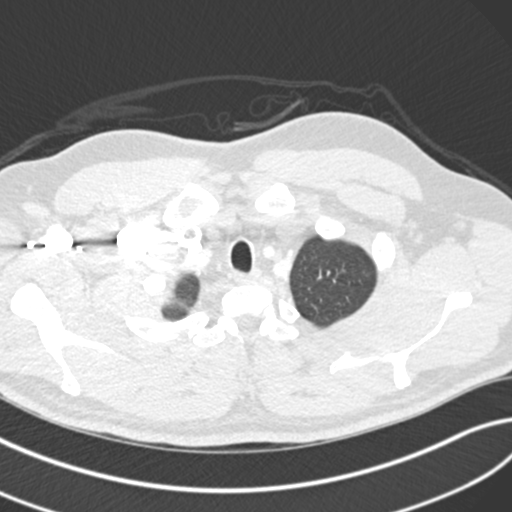
[im 288/304  mediastinal]
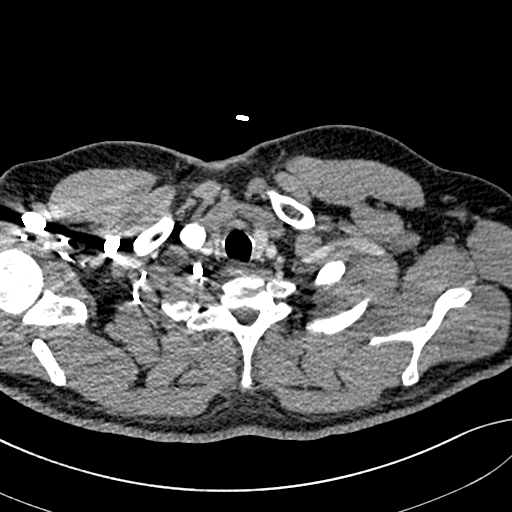

[Series 7: pe coronal mpr · coronal · 0.61mm/px · 1 of 135 slices shown]
[im 68/135  mediastinal]
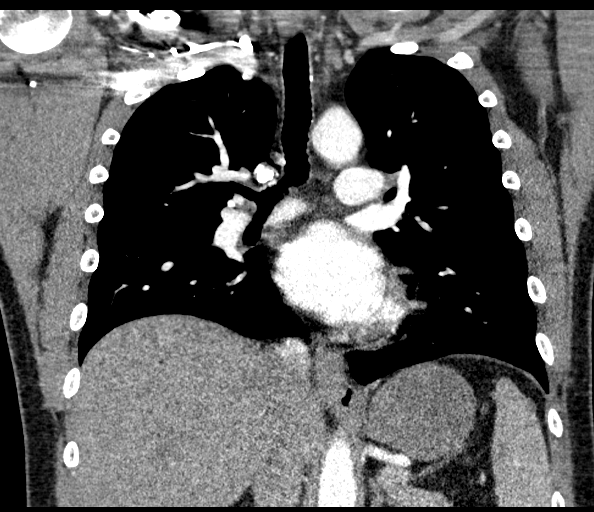

[19 of 36 positions shown; findings below may reference images not displayed]

FINDINGS: Cardiovascular: Satisfactory opacification of the pulmonary arteries
to the segmental level. No evidence of pulmonary embolism. Normal
heart size. No pericardial effusion. Non aneurysmal aorta. No
dissection.

Mediastinum/Nodes: No enlarged mediastinal, hilar, or axillary lymph
nodes. Thyroid gland, trachea, and esophagus demonstrate no
significant findings.

Lungs/Pleura: Lungs are clear. No pleural effusion or pneumothorax.

Upper Abdomen: No acute abnormality.

Musculoskeletal: No chest wall abnormality. No acute or significant
osseous findings.

Review of the MIP images confirms the above findings.
IMPRESSION: Negative for acute pulmonary embolus or aortic dissection. Clear
lung fields.

These results will be called to the ordering clinician or
representative by the Radiologist Assistant, and communication
documented in the PACS or zVision Dashboard.

## 2022-12-21 ENCOUNTER — Ambulatory Visit
Admission: RE | Admit: 2022-12-21 | Discharge: 2022-12-21 | Disposition: A | Discharge status: Home / Self Care | Payer: Managed Care, Other (non HMO) | Source: Ambulatory Visit | Attending: Family Medicine | Admitting: Family Medicine

## 2022-12-21 VITALS — BP 154/85 | HR 75 | Temp 98.3°F | Resp 16 | Ht 72.0 in | Wt 220.0 lb

## 2022-12-21 DIAGNOSIS — L237 Allergic contact dermatitis due to plants, except food: Secondary | ICD-10-CM | POA: Diagnosis not present

## 2022-12-21 NOTE — Discharge Instructions (Addendum)
May take antihistamine as needed for the itching

## 2022-12-21 NOTE — ED Provider Notes (Signed)
[facility]    CSN: [id number] Arrival date & time: 12/21/22  1052      History   Chief Complaint Chief Complaint  Patient presents with   Rash    Itchy rash - would like to identify and treat as needed - Entered by patient    HPI Bobby Petersen is a 52 y.o. male.   Patient states that he was cleaning of the back of his yard a little over a week ago.  He states that he pulled a lot of vines and brush.  He did not bathe immediately after.  He started breaking out with poison ivy within a few days.  He has it on the right side of his face and right eyelid, both arms, both legs, few spots on his stomach.  It seems like he is continuing to get new areas and wonders if he is contagious.    Past Medical History:  Diagnosis Date   Complication of anesthesia    had 2 surgery closed together and stayed drunk foer a bit   Constipation    Pneumonia    as a teenager    Patient Active Problem List   Diagnosis Date Noted   History of panic attacks 07/09/2016    Past Surgical History:  Procedure Laterality Date   BACK SURGERY     HERNIA REPAIR Left    LUMBAR LAMINECTOMY/DECOMPRESSION MICRODISCECTOMY Left 05/08/2015   Procedure: LUMBAR LAMINECTOMY/DECOMPRESSION MICRODISCECTOMY;  Surgeon: Estill Bamberg, MD;  Location: [facility] OR;  Service: Orthopedics;  Laterality: Left;  Left sided lumbar 4-5 microdisectomy   NASAL POLYP SURGERY     NASAL SEPTUM SURGERY     WISDOM TOOTH EXTRACTION         Home Medications    Prior to Admission medications   Medication Sig Start Date End Date Taking? Authorizing Provider  cetirizine (ZYRTEC) 1 MG/ML syrup Take by mouth.   Yes [provider]  Fluocinolone Acetonide Body (DERMA-SMOOTHE/FS BODY) 0.01 % OIL Apply topically.   Yes [provider]  fluticasone (FLONASE) 50 MCG/ACT nasal spray Place into the nose.   Yes [provider]    Family History History reviewed. No pertinent family history.  Social  History Social History   Tobacco Use   Smoking status: Former   Smokeless tobacco: Never   Tobacco comments:    off and on for  years  Vaping Use   Vaping status: Never Used  Substance Use Topics   Alcohol use: No   Drug use: No     Allergies   Penicillins   Review of Systems Review of Systems See HPI  Physical Exam Triage Vital Signs ED Triage Vitals  Encounter Vitals Group     BP 12/21/22 1106 (!) 154/85     Systolic BP Percentile --      Diastolic BP Percentile --      Pulse Rate 12/21/22 1106 75     Resp 12/21/22 1106 16     Temp 12/21/22 1106 98.3 F (36.8 C)     Temp Source 12/21/22 1106 Oral     SpO2 12/21/22 1106 99 %     Weight 12/21/22 1110 220 lb (99.8 kg)     Height 12/21/22 1110 6' (1.829 m)     Head Circumference --      Peak Flow --      Pain Score 12/21/22 1110 0     Pain Loc --      Pain Education --  Exclude from Growth Chart --    No data found.  Updated Vital Signs BP (!) 154/85 (BP Location: Right Arm)   Pulse 75   Temp 98.3 F (36.8 C) (Oral)   Resp 16   Ht 6' (1.829 m)   Wt 99.8 kg   SpO2 99%   BMI 29.84 kg/m      Physical Exam Constitutional:      General: He is not in acute distress.    Appearance: He is well-developed and normal weight.  HENT:     Head: Normocephalic and atraumatic.  Eyes:     Conjunctiva/sclera: Conjunctivae normal.     Pupils: Pupils are equal, round, and reactive to light.  Cardiovascular:     Rate and Rhythm: Normal rate.  Pulmonary:     Effort: Pulmonary effort is normal. No respiratory distress.  Abdominal:     General: There is no distension.     Palpations: Abdomen is soft.  Musculoskeletal:        General: Normal range of motion.     Cervical back: Normal range of motion.  Skin:    General: Skin is warm and dry.     Findings: Rash present.     Comments: Patient has faint erythema and slight swelling of the right upper eyelid.  There are scattered patches of vesicular rash on both  forearms.  There is a larger area of rash on the inside of both thighs, measuring 10 or more centimeters across.  All of it is in various stages of healing.  Neurological:     Mental Status: He is alert.      UC Treatments / Results  Labs (all labs ordered are listed, but only abnormal results are displayed) Labs Reviewed - No data to display  EKG   Radiology No results found.  Procedures Procedures (including critical care time)  Medications Ordered in UC Medications - No data to display  Initial Impression / Assessment and Plan / UC Course  I have reviewed the triage vital signs and the nursing notes.  Pertinent labs & imaging results that were available during my care of the patient were reviewed by me and considered in my medical decision making (see chart for details).     Discussed the nature of contact dermatitis.  Home treatment.  Contagion (none) Final Clinical Impressions(s) / UC Diagnoses   Final diagnoses:  Allergic contact dermatitis due to plants, except food     Discharge Instructions      May take antihistamine as needed for the itching   ED Prescriptions   None    PDMP not reviewed this encounter.   Eustace Moore, MD 12/21/22 [phone]

## 2022-12-21 NOTE — ED Triage Notes (Signed)
Patient c/o rash on right eye, left upper arm, now on both inner thighs x 1 week.  Patient thinks it's poison oak after cutting a vine in his backyard.  The areas do itch at times, some redness.  Patient has applied Cortisone cream and taken Benadryl last night.

## 2023-10-04 ENCOUNTER — Other Ambulatory Visit: Payer: Self-pay | Admitting: Orthopedic Surgery

## 2023-10-04 DIAGNOSIS — M5416 Radiculopathy, lumbar region: Secondary | ICD-10-CM

## 2023-10-05 ENCOUNTER — Ambulatory Visit

## 2023-10-05 DIAGNOSIS — M5416 Radiculopathy, lumbar region: Secondary | ICD-10-CM

## 2023-11-02 ENCOUNTER — Other Ambulatory Visit: Payer: Self-pay | Admitting: Orthopedic Surgery

## 2023-12-02 NOTE — Pre-Procedure Instructions (Signed)
 Surgical Instructions   Your procedure is scheduled on December 08, 2023. Report to [facility] Main Entrance A at 7:45 A.M., then check in with the Admitting office. Any questions or running late day of surgery: call [phone]  Questions prior to your surgery date: call [phone], Monday-Friday, 8am-4pm. If you experience any cold or flu symptoms such as cough, fever, chills, shortness of breath, etc. between now and your scheduled surgery, please notify us  at the above number.     Remember:  Do not eat after midnight the night before your surgery  You may drink clear liquids until 7:45 AM the morning of your surgery.   Clear liquids allowed are: Water, Non-Citrus Juices (without pulp), Carbonated Beverages, Clear Tea (no milk, honey, etc.), Black Coffee Only (NO MILK, CREAM OR POWDERED CREAMER of any kind), and Gatorade.  Patient Instructions  The night before surgery:  No food after midnight. ONLY clear liquids after midnight  The day of surgery (if you do NOT have diabetes):  Drink ONE (1) Pre-Surgery Clear Ensure by 7:45 AM the morning of surgery. Drink in one sitting. Do not sip.  This drink was given to you during your hospital  pre-op appointment visit.  Nothing else to drink after completing the  Pre-Surgery Clear Ensure.         If you have questions, please contact your surgeon's office.   Take these medicines the morning of surgery with A SIP OF WATER: acetaminophen  (TYLENOL ) - may take if needed   One week prior to surgery, STOP taking any Aspirin (unless otherwise instructed by your surgeon) Aleve, Naproxen, Ibuprofen, Motrin, Advil, Goody's, BC's, all herbal medications, fish oil, and non-prescription vitamins.                     Do NOT Smoke (Tobacco/Vaping) for 24 hours prior to your procedure.  If you use a CPAP at night, you may bring your mask/headgear for your overnight stay.   You will be asked to remove any contacts, glasses, piercing's,  hearing aid's, dentures/partials prior to surgery. Please bring cases for these items if needed.    Patients discharged the day of surgery will not be allowed to drive home, and someone needs to stay with them for 24 hours.  SURGICAL WAITING ROOM VISITATION Patients may have no more than 2 support people in the waiting area - these visitors may rotate.   Pre-op nurse will coordinate an appropriate time for 1 ADULT support person, who may not rotate, to accompany patient in pre-op.  Children under the age of 55 must have an adult with them who is not the patient and must remain in the main waiting area with an adult.  If the patient needs to stay at the hospital during part of their recovery, the visitor guidelines for inpatient rooms apply.  Please refer to the [facility] website for the visitor guidelines for any additional information.   If you received a COVID test during your pre-op visit  it is requested that you wear a mask when out in public, stay away from anyone that may not be feeling well and notify your surgeon if you develop symptoms. If you have been in contact with anyone that has tested positive in the last 10 days please notify you surgeon.      Pre-operative 5 CHG Bathing Instructions   You can play a key role in reducing the risk of infection after surgery. Your skin needs to be as free  of germs as possible. You can reduce the number of germs on your skin by washing with CHG (chlorhexidine  gluconate) soap before surgery. CHG is an antiseptic soap that kills germs and continues to kill germs even after washing.   DO NOT use if you have an allergy to chlorhexidine /CHG or antibacterial soaps. If your skin becomes reddened or irritated, stop using the CHG and notify one of our RNs at [phone].   Please shower with the CHG soap starting 4 days before surgery using the following schedule:     Please keep in mind the following:  DO NOT shave, including legs and  underarms, starting the day of your first shower.   You may shave your face at any point before/day of surgery.  Place clean sheets on your bed the day you start using CHG soap. Use a clean washcloth (not used since being washed) for each shower. DO NOT sleep with pets once you start using the CHG.   CHG Shower Instructions:  Wash your face and private area with normal soap. If you choose to wash your hair, wash first with your normal shampoo.  After you use shampoo/soap, rinse your hair and body thoroughly to remove shampoo/soap residue.  Turn the water OFF and apply about 3 tablespoons (45 ml) of CHG soap to a CLEAN washcloth.  Apply CHG soap ONLY FROM YOUR NECK DOWN TO YOUR TOES (washing for 3-5 minutes)  DO NOT use CHG soap on face, private areas, open wounds, or sores.  Pay special attention to the area where your surgery is being performed.  If you are having back surgery, having someone wash your back for you may be helpful. Wait 2 minutes after CHG soap is applied, then you may rinse off the CHG soap.  Pat dry with a clean towel  Put on clean clothes/pajamas   If you choose to wear lotion, please use ONLY the CHG-compatible lotions that are listed below.  Additional instructions for the day of surgery: DO NOT APPLY any lotions, deodorants, cologne, or perfumes.   Do not bring valuables to the hospital. [facility] is not responsible for any belongings/valuables. Do not wear nail polish, gel polish, artificial nails, or any other type of covering on natural nails (fingers and toes) Do not wear jewelry or makeup Put on clean/comfortable clothes.  Please brush your teeth.  Ask your nurse before applying any prescription medications to the skin.     CHG Compatible Lotions   Aveeno Moisturizing lotion  Cetaphil Moisturizing Cream  Cetaphil Moisturizing Lotion  Clairol Herbal Essence Moisturizing Lotion, Dry Skin  Clairol Herbal Essence Moisturizing Lotion, Extra Dry Skin   Clairol Herbal Essence Moisturizing Lotion, Normal Skin  Curel Age Defying Therapeutic Moisturizing Lotion with Alpha Hydroxy  Curel Extreme Care Body Lotion  Curel Soothing Hands Moisturizing Hand Lotion  Curel Therapeutic Moisturizing Cream, Fragrance-Free  Curel Therapeutic Moisturizing Lotion, Fragrance-Free  Curel Therapeutic Moisturizing Lotion, Original Formula  Eucerin Daily Replenishing Lotion  Eucerin Dry Skin Therapy Plus Alpha Hydroxy Crme  Eucerin Dry Skin Therapy Plus Alpha Hydroxy Lotion  Eucerin Original Crme  Eucerin Original Lotion  Eucerin Plus Crme Eucerin Plus Lotion  Eucerin TriLipid Replenishing Lotion  Keri Anti-Bacterial Hand Lotion  Keri Deep Conditioning Original Lotion Dry Skin Formula Softly Scented  Keri Deep Conditioning Original Lotion, Fragrance Free Sensitive Skin Formula  Keri Lotion Fast Absorbing Fragrance Free Sensitive Skin Formula  Keri Lotion Fast Absorbing Softly Scented Dry Skin Formula  Keri Original Lotion  [employer]  Skin Renewal Lotion Keri Silky Smooth Lotion  Keri Silky Smooth Sensitive Skin Lotion  Nivea Body Creamy Conditioning Oil  Nivea Body Extra Enriched Lotion  Nivea Body Original Lotion  Nivea Body Sheer Moisturizing Lotion Nivea Crme  Nivea Skin Firming Lotion  NutraDerm 30 Skin Lotion  NutraDerm Skin Lotion  NutraDerm Therapeutic Skin Cream  NutraDerm Therapeutic Skin Lotion  ProShield Protective Hand Cream  Provon moisturizing lotion  Please read over the following fact sheets that you were given.

## 2023-12-03 ENCOUNTER — Other Ambulatory Visit: Payer: Self-pay

## 2023-12-03 ENCOUNTER — Encounter (HOSPITAL_COMMUNITY): Payer: Self-pay

## 2023-12-03 ENCOUNTER — Encounter (HOSPITAL_COMMUNITY)
Admission: RE | Admit: 2023-12-03 | Discharge: 2023-12-03 | Disposition: A | Discharge status: Home / Self Care | Source: Ambulatory Visit | Attending: Orthopedic Surgery | Admitting: Orthopedic Surgery

## 2023-12-03 VITALS — BP 107/82 | HR 62 | Temp 98.2°F | Resp 18 | Ht 72.0 in | Wt 224.6 lb

## 2023-12-03 DIAGNOSIS — Z01812 Encounter for preprocedural laboratory examination: Secondary | ICD-10-CM | POA: Diagnosis present

## 2023-12-03 DIAGNOSIS — Z01818 Encounter for other preprocedural examination: Secondary | ICD-10-CM

## 2023-12-03 LAB — TYPE AND SCREEN
ABO/RH(D): A POS
Antibody Screen: NEGATIVE

## 2023-12-03 LAB — CBC
HCT: 44 % (ref 39.0–52.0)
Hemoglobin: 15.3 g/dL (ref 13.0–17.0)
MCH: 30.7 pg (ref 26.0–34.0)
MCHC: 34.8 g/dL (ref 30.0–36.0)
MCV: 88.2 fL (ref 80.0–100.0)
Platelets: 223 K/uL (ref 150–400)
RBC: 4.99 MIL/uL (ref 4.22–5.81)
RDW: 13 % (ref 11.5–15.5)
WBC: 4.6 K/uL (ref 4.0–10.5)
nRBC: 0 % (ref 0.0–0.2)

## 2023-12-03 LAB — BASIC METABOLIC PANEL WITH GFR
Anion gap: 7 (ref 5–15)
BUN: 19 mg/dL (ref 6–20)
CO2: 26 mmol/L (ref 22–32)
Calcium: 9.4 mg/dL (ref 8.9–10.3)
Chloride: 104 mmol/L (ref 98–111)
Creatinine, Ser: 1.04 mg/dL (ref 0.61–1.24)
GFR, Estimated: >60 mL/min (ref >60)
Glucose, Bld: 112 mg/dL — ABNORMAL HIGH (ref 70–99)
Potassium: 4 mmol/L (ref 3.5–5.1)
Sodium: 137 mmol/L (ref 135–145)

## 2023-12-03 LAB — SURGICAL PCR SCREEN
MRSA, PCR: NEGATIVE
Staphylococcus aureus: POSITIVE — AB

## 2023-12-03 NOTE — Progress Notes (Signed)
 PCP - Dr. Prentice Pinal Cardiologist -   PPM/ICD - denies Device Orders - na Rep Notified - na  Chest x-ray - na EKG - na Stress Test - approx 20 years ago ECHO -  Cardiac Cath -   Sleep Study - denies CPAP - na  Non-diabetic  Blood Thinner Instructions: denies Aspirin Instructions:denies  ERAS Protcol - Ensure until 0745  Anesthesia review: No  Patient denies shortness of breath, fever, cough and chest pain at PAT appointment   All instructions explained to the patient, with a verbal understanding of the material. Patient agrees to go over the instructions while at home for a better understanding. Patient also instructed to self quarantine after being tested for COVID-19. The opportunity to ask questions was provided.

## 2023-12-08 ENCOUNTER — Encounter (HOSPITAL_COMMUNITY): Payer: Self-pay | Admitting: Orthopedic Surgery

## 2023-12-08 ENCOUNTER — Ambulatory Visit (HOSPITAL_COMMUNITY)

## 2023-12-08 ENCOUNTER — Other Ambulatory Visit: Payer: Self-pay

## 2023-12-08 ENCOUNTER — Ambulatory Visit (HOSPITAL_COMMUNITY)
Admission: RE | Disposition: A | Discharge status: Home / Self Care | Payer: Self-pay | Source: Home / Self Care | Attending: Orthopedic Surgery

## 2023-12-08 ENCOUNTER — Ambulatory Visit (HOSPITAL_BASED_OUTPATIENT_CLINIC_OR_DEPARTMENT_OTHER): Admitting: Anesthesiology

## 2023-12-08 ENCOUNTER — Observation Stay (HOSPITAL_COMMUNITY)
Admission: RE | Admit: 2023-12-08 | Discharge: 2023-12-09 | Disposition: A | Discharge status: Home / Self Care | Attending: Orthopedic Surgery | Admitting: Orthopedic Surgery

## 2023-12-08 ENCOUNTER — Ambulatory Visit (HOSPITAL_COMMUNITY): Admitting: Anesthesiology

## 2023-12-08 DIAGNOSIS — M5416 Radiculopathy, lumbar region: Principal | ICD-10-CM | POA: Diagnosis present

## 2023-12-08 DIAGNOSIS — M5116 Intervertebral disc disorders with radiculopathy, lumbar region: Secondary | ICD-10-CM | POA: Diagnosis not present

## 2023-12-08 DIAGNOSIS — Z87891 Personal history of nicotine dependence: Secondary | ICD-10-CM | POA: Insufficient documentation

## 2023-12-08 DIAGNOSIS — M79605 Pain in left leg: Secondary | ICD-10-CM | POA: Diagnosis present

## 2023-12-08 HISTORY — PX: TRANSFORAMINAL LUMBAR INTERBODY FUSION (TLIF) WITH PEDICLE SCREW FIXATION 1 LEVEL: SHX6141

## 2023-12-08 LAB — ABO/RH: ABO/RH(D): A POS

## 2023-12-08 SURGERY — TRANSFORAMINAL LUMBAR INTERBODY FUSION (TLIF) WITH PEDICLE SCREW FIXATION 1 LEVEL
Anesthesia: General | Laterality: Left

## 2023-12-08 MED ORDER — BUPIVACAINE-EPINEPHRINE (PF) 0.25% -1:200000 IJ SOLN
INTRAMUSCULAR | Status: AC
  Filled 2023-12-08: qty 30

## 2023-12-08 MED ORDER — HYDROMORPHONE HCL 1 MG/ML IJ SOLN
INTRAMUSCULAR | Status: AC
  Filled 2023-12-08: qty 0.5

## 2023-12-08 MED ORDER — CEFAZOLIN SODIUM-DEXTROSE 2-4 GM/100ML-% IV SOLN
2.0000 g | INTRAVENOUS | Status: AC
  Administered 2023-12-08: 2 g via INTRAVENOUS
  Filled 2023-12-08: qty 100

## 2023-12-08 MED ORDER — CHLORHEXIDINE GLUCONATE CLOTH 2 % EX PADS: 6.0000 | MEDICATED_PAD | Freq: Every day | CUTANEOUS | Status: DC

## 2023-12-08 MED ORDER — DEXAMETHASONE SODIUM PHOSPHATE 10 MG/ML IJ SOLN
INTRAMUSCULAR | Status: AC
  Filled 2023-12-08: qty 1

## 2023-12-08 MED ORDER — MORPHINE SULFATE (PF) 2 MG/ML IV SOLN: 2.0000 mg | INTRAVENOUS | Status: DC | PRN

## 2023-12-08 MED ORDER — POVIDONE-IODINE 7.5 % EX SOLN
Freq: Once | CUTANEOUS | Status: DC
  Filled 2023-12-08: qty 118

## 2023-12-08 MED ORDER — BUPIVACAINE LIPOSOME 1.3 % IJ SUSP
INTRAMUSCULAR | Status: DC | PRN
  Administered 2023-12-08: 40 mL

## 2023-12-08 MED ORDER — PROPOFOL 10 MG/ML IV BOLUS
INTRAVENOUS | Status: AC
  Filled 2023-12-08: qty 20

## 2023-12-08 MED ORDER — SENNOSIDES-DOCUSATE SODIUM 8.6-50 MG PO TABS: 1.0000 | ORAL_TABLET | Freq: Every evening | ORAL | Status: DC | PRN

## 2023-12-08 MED ORDER — HYDROCODONE-ACETAMINOPHEN 5-325 MG PO TABS: 1.0000 | ORAL_TABLET | ORAL | Status: DC | PRN

## 2023-12-08 MED ORDER — MIDAZOLAM HCL 2 MG/2ML IJ SOLN
INTRAMUSCULAR | Status: DC | PRN
  Administered 2023-12-08: 2 mg via INTRAVENOUS

## 2023-12-08 MED ORDER — ALBUMIN HUMAN 5 % IV SOLN: INTRAVENOUS | Status: DC | PRN

## 2023-12-08 MED ORDER — ONDANSETRON HCL 4 MG/2ML IJ SOLN
INTRAMUSCULAR | Status: DC | PRN
  Administered 2023-12-08: 4 mg via INTRAVENOUS

## 2023-12-08 MED ORDER — SODIUM CHLORIDE 0.9% FLUSH
3.0000 mL | Freq: Two times a day (BID) | INTRAVENOUS | Status: DC
  Administered 2023-12-08: 3 mL via INTRAVENOUS

## 2023-12-08 MED ORDER — CHLORHEXIDINE GLUCONATE 0.12 % MT SOLN
15.0000 mL | Freq: Once | OROMUCOSAL | Status: AC
  Administered 2023-12-08: 15 mL via OROMUCOSAL
  Filled 2023-12-08: qty 15

## 2023-12-08 MED ORDER — ALUM & MAG HYDROXIDE-SIMETH 200-200-20 MG/5ML PO SUSP: 30.0000 mL | Freq: Four times a day (QID) | ORAL | Status: DC | PRN

## 2023-12-08 MED ORDER — METHOCARBAMOL 500 MG PO TABS
500.0000 mg | ORAL_TABLET | Freq: Four times a day (QID) | ORAL | Status: DC | PRN
  Administered 2023-12-08 – 2023-12-09 (×3): 500 mg via ORAL
  Filled 2023-12-08 (×3): qty 1

## 2023-12-08 MED ORDER — FENTANYL CITRATE (PF) 250 MCG/5ML IJ SOLN
INTRAMUSCULAR | Status: AC
  Filled 2023-12-08: qty 5

## 2023-12-08 MED ORDER — BISACODYL 5 MG PO TBEC
5.0000 mg | DELAYED_RELEASE_TABLET | Freq: Every day | ORAL | Status: DC | PRN
Start: 2023-12-08 — End: 2023-12-09
  Filled 2023-12-08: qty 1

## 2023-12-08 MED ORDER — ONDANSETRON HCL 4 MG PO TABS: 4.0000 mg | ORAL_TABLET | Freq: Four times a day (QID) | ORAL | Status: DC | PRN

## 2023-12-08 MED ORDER — OXYCODONE HCL 5 MG PO TABS: 5.0000 mg | ORAL_TABLET | Freq: Once | ORAL | Status: DC | PRN

## 2023-12-08 MED ORDER — ACETAMINOPHEN 325 MG PO TABS: 650.0000 mg | ORAL_TABLET | ORAL | Status: DC | PRN

## 2023-12-08 MED ORDER — ONDANSETRON HCL 4 MG/2ML IJ SOLN
INTRAMUSCULAR | Status: AC
  Filled 2023-12-08: qty 2

## 2023-12-08 MED ORDER — CEFAZOLIN SODIUM-DEXTROSE 2-4 GM/100ML-% IV SOLN
2.0000 g | Freq: Three times a day (TID) | INTRAVENOUS | Status: AC
  Administered 2023-12-08 – 2023-12-09 (×2): 2 g via INTRAVENOUS
  Filled 2023-12-08 (×2): qty 100

## 2023-12-08 MED ORDER — LACTATED RINGERS IV SOLN: INTRAVENOUS | Status: DC

## 2023-12-08 MED ORDER — FLEET ENEMA RE ENEM: 1.0000 | ENEMA | Freq: Once | RECTAL | Status: DC | PRN

## 2023-12-08 MED ORDER — BUPIVACAINE LIPOSOME 1.3 % IJ SUSP
INTRAMUSCULAR | Status: AC
  Filled 2023-12-08: qty 20

## 2023-12-08 MED ORDER — SODIUM CHLORIDE 0.9 % IV SOLN: 250.0000 mL | INTRAVENOUS | Status: DC

## 2023-12-08 MED ORDER — ORAL CARE MOUTH RINSE: 15.0000 mL | Freq: Once | OROMUCOSAL | Status: AC

## 2023-12-08 MED ORDER — HYDROMORPHONE HCL 1 MG/ML IJ SOLN
INTRAMUSCULAR | Status: DC | PRN
  Administered 2023-12-08 (×2): .5 mg via INTRAVENOUS

## 2023-12-08 MED ORDER — PHENYLEPHRINE HCL-NACL 20-0.9 MG/250ML-% IV SOLN
INTRAVENOUS | Status: DC | PRN
  Administered 2023-12-08: 25 ug/min via INTRAVENOUS

## 2023-12-08 MED ORDER — THROMBIN 20000 UNITS EX KIT
PACK | CUTANEOUS | Status: DC | PRN
  Administered 2023-12-08: 20 mL via TOPICAL

## 2023-12-08 MED ORDER — MUPIROCIN 2 % EX OINT
1.0000 | TOPICAL_OINTMENT | Freq: Two times a day (BID) | CUTANEOUS | Status: DC
  Administered 2023-12-09: 1 via NASAL
  Filled 2023-12-08 (×2): qty 22

## 2023-12-08 MED ORDER — ROCURONIUM BROMIDE 10 MG/ML (PF) SYRINGE
PREFILLED_SYRINGE | INTRAVENOUS | Status: AC
  Filled 2023-12-08: qty 10

## 2023-12-08 MED ORDER — LORATADINE 10 MG PO TABS
10.0000 mg | ORAL_TABLET | Freq: Every day | ORAL | Status: DC
  Administered 2023-12-08: 10 mg via ORAL
  Filled 2023-12-08: qty 1

## 2023-12-08 MED ORDER — AMISULPRIDE (ANTIEMETIC) 5 MG/2ML IV SOLN: 10.0000 mg | Freq: Once | INTRAVENOUS | Status: DC | PRN

## 2023-12-08 MED ORDER — SUGAMMADEX SODIUM 200 MG/2ML IV SOLN
INTRAVENOUS | Status: DC | PRN
  Administered 2023-12-08: 200 mg via INTRAVENOUS

## 2023-12-08 MED ORDER — ROCURONIUM BROMIDE 10 MG/ML (PF) SYRINGE
PREFILLED_SYRINGE | INTRAVENOUS | Status: DC | PRN
  Administered 2023-12-08: 30 mg via INTRAVENOUS
  Administered 2023-12-08: 20 mg via INTRAVENOUS
  Administered 2023-12-08: 10 mg via INTRAVENOUS
  Administered 2023-12-08: 60 mg via INTRAVENOUS

## 2023-12-08 MED ORDER — MAGNESIUM CITRATE PO SOLN
1.0000 | Freq: Once | ORAL | Status: AC
  Administered 2023-12-08: 1 via ORAL
  Filled 2023-12-08: qty 296

## 2023-12-08 MED ORDER — DEXMEDETOMIDINE HCL IN NACL 80 MCG/20ML IV SOLN
INTRAVENOUS | Status: DC | PRN
Start: 2023-12-08 — End: 2023-12-08
  Administered 2023-12-08 (×4): 8 ug via INTRAVENOUS

## 2023-12-08 MED ORDER — 0.9 % SODIUM CHLORIDE (POUR BTL) OPTIME
TOPICAL | Status: DC | PRN
  Administered 2023-12-08 (×3): 1000 mL

## 2023-12-08 MED ORDER — PHENOL 1.4 % MT LIQD: 1.0000 | OROMUCOSAL | Status: DC | PRN

## 2023-12-08 MED ORDER — SODIUM CHLORIDE 0.9% FLUSH: 3.0000 mL | INTRAVENOUS | Status: DC | PRN

## 2023-12-08 MED ORDER — POTASSIUM CHLORIDE IN NACL 20-0.9 MEQ/L-% IV SOLN
INTRAVENOUS | Status: DC
  Filled 2023-12-08: qty 1000

## 2023-12-08 MED ORDER — DEXAMETHASONE SODIUM PHOSPHATE 10 MG/ML IJ SOLN
INTRAMUSCULAR | Status: DC | PRN
  Administered 2023-12-08: 10 mg via INTRAVENOUS

## 2023-12-08 MED ORDER — HYDROMORPHONE HCL 1 MG/ML IJ SOLN
INTRAMUSCULAR | Status: AC
Start: 2023-12-08 — End: 2023-12-08
  Filled 2023-12-08: qty 0.5

## 2023-12-08 MED ORDER — FENTANYL CITRATE (PF) 250 MCG/5ML IJ SOLN
INTRAMUSCULAR | Status: DC | PRN
  Administered 2023-12-08 (×5): 50 ug via INTRAVENOUS

## 2023-12-08 MED ORDER — FENTANYL CITRATE (PF) 100 MCG/2ML IJ SOLN
25.0000 ug | INTRAMUSCULAR | Status: DC | PRN
  Administered 2023-12-08: 25 ug via INTRAVENOUS

## 2023-12-08 MED ORDER — OXYCODONE HCL 5 MG/5ML PO SOLN: 5.0000 mg | Freq: Once | ORAL | Status: DC | PRN

## 2023-12-08 MED ORDER — LIDOCAINE 2% (20 MG/ML) 5 ML SYRINGE
INTRAMUSCULAR | Status: DC | PRN
  Administered 2023-12-08: 60 mg via INTRAVENOUS

## 2023-12-08 MED ORDER — PHENYLEPHRINE 80 MCG/ML (10ML) SYRINGE FOR IV PUSH (FOR BLOOD PRESSURE SUPPORT)
PREFILLED_SYRINGE | INTRAVENOUS | Status: DC | PRN
  Administered 2023-12-08 (×3): 80 ug via INTRAVENOUS

## 2023-12-08 MED ORDER — BUPIVACAINE-EPINEPHRINE 0.25% -1:200000 IJ SOLN
INTRAMUSCULAR | Status: DC | PRN
  Administered 2023-12-08: 10 mL

## 2023-12-08 MED ORDER — METHOCARBAMOL 1000 MG/10ML IJ SOLN: 500.0000 mg | Freq: Four times a day (QID) | INTRAMUSCULAR | Status: DC | PRN

## 2023-12-08 MED ORDER — OXYCODONE-ACETAMINOPHEN 5-325 MG PO TABS
1.0000 | ORAL_TABLET | ORAL | Status: DC | PRN
  Administered 2023-12-08 – 2023-12-09 (×5): 2 via ORAL
  Filled 2023-12-08 (×5): qty 2

## 2023-12-08 MED ORDER — LIDOCAINE 2% (20 MG/ML) 5 ML SYRINGE
INTRAMUSCULAR | Status: AC
  Filled 2023-12-08: qty 5

## 2023-12-08 MED ORDER — THROMBIN 20000 UNITS EX SOLR
CUTANEOUS | Status: AC
  Filled 2023-12-08: qty 20000

## 2023-12-08 MED ORDER — ONDANSETRON HCL 4 MG/2ML IJ SOLN: 4.0000 mg | Freq: Four times a day (QID) | INTRAMUSCULAR | Status: DC | PRN

## 2023-12-08 MED ORDER — DOCUSATE SODIUM 100 MG PO CAPS
100.0000 mg | ORAL_CAPSULE | Freq: Two times a day (BID) | ORAL | Status: DC
  Administered 2023-12-08: 100 mg via ORAL
  Filled 2023-12-08: qty 1

## 2023-12-08 MED ORDER — FENTANYL CITRATE (PF) 100 MCG/2ML IJ SOLN
INTRAMUSCULAR | Status: AC
  Filled 2023-12-08: qty 2

## 2023-12-08 MED ORDER — MENTHOL 3 MG MT LOZG: 1.0000 | LOZENGE | OROMUCOSAL | Status: DC | PRN

## 2023-12-08 MED ORDER — ZOLPIDEM TARTRATE 5 MG PO TABS
5.0000 mg | ORAL_TABLET | Freq: Every evening | ORAL | Status: DC | PRN
  Filled 2023-12-08: qty 1

## 2023-12-08 MED ORDER — PROPOFOL 10 MG/ML IV BOLUS
INTRAVENOUS | Status: DC | PRN
  Administered 2023-12-08: 200 mg via INTRAVENOUS

## 2023-12-08 MED ORDER — MIDAZOLAM HCL 2 MG/2ML IJ SOLN
INTRAMUSCULAR | Status: AC
  Filled 2023-12-08: qty 2

## 2023-12-08 MED ORDER — ACETAMINOPHEN 650 MG RE SUPP: 650.0000 mg | RECTAL | Status: DC | PRN

## 2023-12-08 MED ORDER — ACETAMINOPHEN 500 MG PO TABS
1000.0000 mg | ORAL_TABLET | Freq: Once | ORAL | Status: AC
  Administered 2023-12-08: 1000 mg via ORAL
  Filled 2023-12-08: qty 2

## 2023-12-08 SURGICAL SUPPLY — 75 items
BAG COUNTER SPONGE SURGICOUNT (BAG) ×1 IMPLANT
BENZOIN TINCTURE PRP APPL 2/3 (GAUZE/BANDAGES/DRESSINGS) ×1 IMPLANT
BLADE CLIPPER SURG (BLADE) IMPLANT
BUR PRECISION FLUTE 5.0 (BURR) ×1 IMPLANT
BUR PRESCISION 1.7 ELITE (BURR) ×1 IMPLANT
BUR ROUND FLUTED 5 RND (BURR) ×1 IMPLANT
CAGE SABLE 10X22 6-12 8D (Cage) IMPLANT
CANNULA GRAFT BNE VG PRE-FILL (Bone Implant) IMPLANT
CNTNR URN SCR LID CUP LEK RST (MISCELLANEOUS) ×1 IMPLANT
COVER BACK TABLE 60X90IN (DRAPES) ×1 IMPLANT
COVER MAYO STAND STRL (DRAPES) ×2 IMPLANT
COVER SURGICAL LIGHT HANDLE (MISCELLANEOUS) ×1 IMPLANT
DISPENSER GRAFT BNE VG (MISCELLANEOUS) IMPLANT
DRAPE C-ARM 42X72 X-RAY (DRAPES) ×1 IMPLANT
DRAPE C-ARMOR (DRAPES) IMPLANT
DRAPE POUCH INSTRU U-SHP 10X18 (DRAPES) ×1 IMPLANT
DRAPE SURG 17X23 STRL (DRAPES) ×4 IMPLANT
DURAPREP 26ML APPLICATOR (WOUND CARE) ×1 IMPLANT
ELECT CAUTERY BLADE 6.4 (BLADE) ×1 IMPLANT
ELECTRODE BLDE 4.0 EZ CLN MEGD (MISCELLANEOUS) ×1 IMPLANT
ELECTRODE REM PT RTRN 9FT ADLT (ELECTROSURGICAL) ×1 IMPLANT
FILTER STRAW FLUID ASPIR (MISCELLANEOUS) ×1 IMPLANT
GAUZE 4X4 16PLY ~~LOC~~+RFID DBL (SPONGE) ×1 IMPLANT
GAUZE SPONGE 4X4 12PLY STRL (GAUZE/BANDAGES/DRESSINGS) ×1 IMPLANT
GLOVE BIO SURGEON STRL SZ 6.5 (GLOVE) ×1 IMPLANT
GLOVE BIO SURGEON STRL SZ8 (GLOVE) ×1 IMPLANT
GLOVE BIOGEL PI IND STRL 7.0 (GLOVE) ×1 IMPLANT
GLOVE BIOGEL PI IND STRL 8 (GLOVE) ×1 IMPLANT
GLOVE SURG ENC MOIS LTX SZ6.5 (GLOVE) ×1 IMPLANT
GOWN STRL REUS W/ TWL LRG LVL3 (GOWN DISPOSABLE) ×2 IMPLANT
GOWN STRL REUS W/ TWL XL LVL3 (GOWN DISPOSABLE) ×1 IMPLANT
IV CATH 14GX2 1/4 (CATHETERS) ×1 IMPLANT
IV CATH AUTO 14GX1.75 SAFE ORG (IV SOLUTION) IMPLANT
KIT BASIN OR (CUSTOM PROCEDURE TRAY) ×1 IMPLANT
KIT POSITIONER JACKSON TABLE (MISCELLANEOUS) ×1 IMPLANT
KIT TURNOVER KIT B (KITS) ×1 IMPLANT
MARKER SKIN DUAL TIP RULER LAB (MISCELLANEOUS) ×2 IMPLANT
NDL 18GX1X1/2 (RX/OR ONLY) (NEEDLE) ×1 IMPLANT
NDL 22X1.5 STRL (OR ONLY) (MISCELLANEOUS) ×2 IMPLANT
NDL HYPO 22X1.5 SAFETY MO (MISCELLANEOUS) IMPLANT
NDL HYPO 25GX1X1/2 BEV (NEEDLE) ×1 IMPLANT
NDL SPNL 18GX3.5 QUINCKE PK (NEEDLE) ×2 IMPLANT
NEEDLE 18GX1X1/2 (RX/OR ONLY) (NEEDLE) ×1 IMPLANT
NEEDLE 22X1.5 STRL (OR ONLY) (MISCELLANEOUS) ×2 IMPLANT
NEEDLE HYPO 22X1.5 SAFETY MO (MISCELLANEOUS) ×1 IMPLANT
NEEDLE HYPO 25GX1X1/2 BEV (NEEDLE) ×1 IMPLANT
NEEDLE SPNL 18GX3.5 QUINCKE PK (NEEDLE) ×2 IMPLANT
NS IRRIG 1000ML POUR BTL (IV SOLUTION) ×1 IMPLANT
PACK LAMINECTOMY ORTHO (CUSTOM PROCEDURE TRAY) ×1 IMPLANT
PACK UNIVERSAL I (CUSTOM PROCEDURE TRAY) ×1 IMPLANT
PAD ARMBOARD POSITIONER FOAM (MISCELLANEOUS) ×2 IMPLANT
PATTIES SURGICAL .5 X.5 (GAUZE/BANDAGES/DRESSINGS) ×1 IMPLANT
PUTTY DBX 2.5CC DEPUY (Putty) IMPLANT
ROD PRE BENT EXP 40MM (Rod) IMPLANT
SCREW SET SINGLE INNER (Screw) IMPLANT
SCREW VIPER CORT FIX 6.00X30 (Screw) IMPLANT
SPONGE INTESTINAL PEANUT (DISPOSABLE) ×1 IMPLANT
SPONGE SURGIFOAM ABS GEL 100 (HEMOSTASIS) ×1 IMPLANT
SPONGE T-LAP 4X18 ~~LOC~~+RFID (SPONGE) IMPLANT
STRIP CLOSURE SKIN 1/2X4 (GAUZE/BANDAGES/DRESSINGS) ×2 IMPLANT
SUT MNCRL AB 4-0 PS2 18 (SUTURE) ×1 IMPLANT
SUT VIC AB 0 CT1 18XCR BRD 8 (SUTURE) IMPLANT
SUT VIC AB 1 CT1 18XCR BRD 8 (SUTURE) IMPLANT
SUT VIC AB 2-0 CT2 18 VCP726D (SUTURE) IMPLANT
SYR 20ML LL LF (SYRINGE) ×2 IMPLANT
SYR BULB IRRIG 60ML STRL (SYRINGE) ×1 IMPLANT
SYR CONTROL 10ML LL (SYRINGE) ×2 IMPLANT
SYR TB 1ML LUER SLIP (SYRINGE) ×1 IMPLANT
TAP EXPEDIUM DL 4.35 (INSTRUMENTS) IMPLANT
TAP EXPEDIUM DL 5.0 (INSTRUMENTS) IMPLANT
TAP EXPEDIUM DL 6.0 (INSTRUMENTS) IMPLANT
TAPE CLOTH 4X10 WHT NS (GAUZE/BANDAGES/DRESSINGS) IMPLANT
TUBE FUNNEL GL DISP (ORTHOPEDIC DISPOSABLE SUPPLIES) IMPLANT
WATER STERILE IRR 1000ML POUR (IV SOLUTION) ×1 IMPLANT
YANKAUER SUCT BULB TIP NO VENT (SUCTIONS) ×1 IMPLANT

## 2023-12-08 NOTE — H&P (Signed)
 PREOPERATIVE H&P  Chief Complaint: Left leg pain and weakness  HPI: Bobby Petersen is a 53 y.o. male who presents with ongoing pain and weakness in the left leg.  He is many years status post a decompression at L4-5.  Given his severe left leg pain and weakness, an MRI was obtained, notable for a recurrent left-sided L4-5 disc herniation.   Patient has failed multiple forms of conservative care and continues to have pain (see office notes for additional details regarding the patient's full course of treatment)  Past Medical History:  Diagnosis Date   Complication of anesthesia    had 2 surgery closed together and stayed drunk foer a bit   Constipation    Pneumonia    as a teenager   Past Surgical History:  Procedure Laterality Date   BACK SURGERY     HERNIA REPAIR Left    LUMBAR LAMINECTOMY/DECOMPRESSION MICRODISCECTOMY Left 05/08/2015   Procedure: LUMBAR LAMINECTOMY/DECOMPRESSION MICRODISCECTOMY;  Surgeon: Oneil Priestly, MD;  Location: [facility] OR;  Service: Orthopedics;  Laterality: Left;  Left sided lumbar 4-5 microdisectomy   NASAL POLYP SURGERY     NASAL SEPTUM SURGERY     WISDOM TOOTH EXTRACTION     Social History   Socioeconomic History   Marital status: Married    Spouse name: Not on file   Number of children: Not on file   Years of education: Not on file   Highest education level: Not on file  Occupational History   Not on file  Tobacco Use   Smoking status: Former   Smokeless tobacco: Former    Types: Chew    Quit date: 11/30/2023   Tobacco comments:    off and on for  years  Vaping Use   Vaping status: Never Used  Substance and Sexual Activity   Alcohol use: No   Drug use: No   Sexual activity: Yes  Other Topics Concern   Not on file  Social History Narrative   Not on file   Social Drivers of Health   Financial Resource Strain: Not on file  Food Insecurity: Not on file  Transportation Needs: Not on file  Physical Activity: Not on file  Stress:  Not on file  Social Connections: Unknown (08/22/2021)   Received from [facility]   Social Network    Social Network: Not on file   History reviewed. No pertinent family history. Allergies  Allergen Reactions   Bupropion Dermatitis   Penicillins Rash    Childhood allergy   Prior to Admission medications   Medication Sig Start Date End Date Taking? Authorizing Provider  acetaminophen  (TYLENOL ) 500 MG tablet Take 500-1,000 mg by mouth every 6 (six) hours as needed (pain.).   Yes [provider]  Berberine Chloride (BERBERINE HCI PO) Take 1 capsule by mouth in the morning. Berberine Phytosome   Yes [provider]  cetirizine (ZYRTEC) 10 MG tablet Take 10 mg by mouth every evening.   Yes [provider]  Creatine POWD Take 5 g by mouth in the morning.   Yes [provider]  Cyanocobalamin (VITAMIN B-12 SL) Place 1 tablet under the tongue daily.   Yes [provider]  Fluocinolone Acetonide Body (DERMA-SMOOTHE/FS BODY) 0.01 % OIL Apply 1 Application topically daily as needed (scalp dermatitis (once a month)).   Yes [provider]  MAGNESIUM  GLYCINATE PO Take 100 mg by mouth at bedtime.   Yes [provider]  OVER THE COUNTER MEDICATION Take 1 packet  by mouth as needed (dip). Nicotine Free Herbal Dip/Snuff (averaging 20/day)   Yes [provider]  traMADol (ULTRAM) 50 MG tablet Take 50 mg by mouth every 6 (six) hours as needed for severe pain (pain score 7-10).   Yes [provider]  Vitamin D-Vitamin K (D3 + K2 PO) Take 1 tablet by mouth in the morning.   Yes [provider]     All other systems have been reviewed and were otherwise negative with the exception of those mentioned in the HPI and as above.  Physical Exam: Vitals:   12/08/23 0802  BP: (!) 142/90  Pulse: 80  Resp: 17  Temp: 97.9 F (36.6 C)  SpO2: 97%    Body mass index is 30.46 kg/m.  General: Alert, no acute  distress Cardiovascular: No pedal edema Respiratory: No cyanosis, no use of accessory musculature Skin: No lesions in the area of chief complaint Neurologic: Sensation intact distally Psychiatric: Patient is competent for consent with normal mood and affect Lymphatic: No axillary or cervical lymphadenopathy   Assessment/Plan: Recurrent left-sided L4-5 disc herniation Plan for Procedure(s): L4-5 decompression, and TRANSFORAMINAL LUMBAR INTERBODY FUSION (TLIF) WITH PEDICLE SCREW FIXATION   Oneil LITTIE Priestly, MD 12/08/2023 10:17 AM

## 2023-12-08 NOTE — Transfer of Care (Signed)
 Immediate Anesthesia Transfer of Care Note  Patient: Bobby Petersen  Procedure(s) Performed: TRANSFORAMINAL LUMBAR INTERBODY FUSION (TLIF) WITH PEDICLE SCREW FIXATION 1 LEVEL (Left)  Patient Location: PACU  Anesthesia Type:General  Level of Consciousness: awake and alert   Airway & Oxygen Therapy: Patient Spontanous Breathing and Patient connected to face mask oxygen  Post-op Assessment: Report given to RN and Post -op Vital signs reviewed and stable  Post vital signs: Reviewed and stable  Last Vitals:  Vitals Value Taken Time  BP 108/71 12/08/23 15:18  Temp 36.9 C 12/08/23 15:18  Pulse 92 12/08/23 15:21  Resp 15 12/08/23 15:21  SpO2 93 % 12/08/23 15:21  Vitals shown include unfiled device data.  Last Pain:  Vitals:   12/08/23 0807  TempSrc:   PainSc: 3          Complications: No notable events documented.

## 2023-12-08 NOTE — Anesthesia Postprocedure Evaluation (Signed)
 Anesthesia Post Note  Patient: Creed Kail  Procedure(s) Performed: TRANSFORAMINAL LUMBAR INTERBODY FUSION (TLIF) WITH PEDICLE SCREW FIXATION 1 LEVEL (Left)     Patient location during evaluation: PACU Anesthesia Type: General Level of consciousness: awake and alert Pain management: pain level controlled Vital Signs Assessment: post-procedure vital signs reviewed and stable Respiratory status: spontaneous breathing, nonlabored ventilation, respiratory function stable and patient connected to nasal cannula oxygen Cardiovascular status: blood pressure returned to baseline and stable Postop Assessment: no apparent nausea or vomiting Anesthetic complications: no   No notable events documented.  Last Vitals:  Vitals:   12/08/23 1615 12/08/23 1630  BP: 104/68 99/64  Pulse:  75  Resp: 10 10  Temp:  36.7 C  SpO2: 92% 99%    Last Pain:  Vitals:   12/08/23 1630  TempSrc:   PainSc: 2                  Lynwood MARLA Cornea

## 2023-12-08 NOTE — Op Note (Addendum)
 PATIENT NAME: Bobby Petersen   MEDICAL RECORD NO.:   [id number]   DATE OF BIRTH: 11-09-1970   DATE OF PROCEDURE: 12/08/2023                               OPERATIVE REPORT     PREOPERATIVE DIAGNOSES: 1. Left-sided lumbar radiculopathy 2. Recurrent large left-sided L4/5 disc herniation 3. L4-5 degenerative disc disease   POSTOPERATIVE DIAGNOSES: 1. Left-sided lumbar radiculopathy 2. Recurrent large left-sided L4/5 disc herniation 3. L4-5 degenerative disc disease   PROCEDURES: 1. L4/5 decompression, including a complex revision discectomy 2. Left-sided L4-5 transforaminal lumbar interbody fusion. 3. Right-sided L4-5 posterolateral fusion. 4. Insertion of interbody device x1 (Globus expandable intervertebral spacer). 5. Placement of segmental posterior instrumentation L4, L5 bilaterally  6. Use of local autograft. 7. Use of morselized allograft - Vivigen 8. Intraoperative use of fluoroscopy.   SURGEON:  Oneil Priestly, MD.   ASSISTANTBETHA Ileana Clara, PA-C.   ANESTHESIA:  General endotracheal anesthesia.   COMPLICATIONS:  None.   DISPOSITION:  Stable.   ESTIMATED BLOOD LOSS:  100cc   INDICATIONS FOR SURGERY:  Briefly,  Mr. Ozment is a pleasant 53 -year-old male who did present to me with severe and ongoing pain and weakness in the left leg.  He is many years status post a left-sided L4-5 microdiscectomy procedure.  More recently, he has been having severe pain in the left leg.  An updated MRI did reveal a large recurrent left-sided L4-5 disc herniation, with obvious significant compression of the traversing left L5 nerve.  Given his ongoing pain and weakness, we did discuss proceeding with the surgery noted above.    OPERATIVE DETAILS:  On 12/08/2023, the patient was brought to surgery and general endotracheal anesthesia was administered.  The patient was placed prone on a well-padded flat Jackson bed with a spinal frame.  Antibiotics were given and a time-out procedure  was performed. The back was prepped and draped in the usual fashion.  A midline incision was made overlying the L4-5 intervertebral spaces.  The fascia was incised at the midline.  The paraspinal musculature was bluntly swept laterally.  Anatomic landmarks for the pedicles were exposed. Using fluoroscopy, I did cannulate the L4 and L5 pedicles bilaterally, using a medial to lateral cortical trajectory technique.  At this point, 6 x 30 mm screws were placed into the right pedicles, and a 40 mm rod was placed into the tulip heads of the screw, and caps were also placed.  Distraction was then applied across the L4-5 intervertebral space, and the caps were then provisionally tightened.  On the left side, bone wax was placed into the cannulated pedicle holes.    I then proceeded with the decompressive aspect of the procedure at the L4-5 level.  A partial facetectomy was performed bilaterally at L4-5.  I then used a series of curettes to find the edge of the laminotomy on the left side at L4-5.  There was abundant granulation tissue noted about the region of the laminotomy.  Once a plane was developed between the dura and bone, a facetectomy was completed on the left.  I then developed a plane between the traversing left L5 nerve and the disc herniation immediately ventral to it.  Of note, this was an extremely meticulous portion of the procedure, as there were prominent adhesions between the nerve and the disc herniation.  A micro nerve hook was utilized to release these  adhesions.  This portion of the procedure took approximately 45 minutes longer than usual, given the significant adhesions encountered.  Once the proper plane was developed, the left L5 nerve was gently medially retracted, and  significant disc material was removed from the lateral recess.  The left L5 nerve was entirely decompressed.  With ongoing gentle medial retraction of the traversing left L5 nerve, an intervertebral discectomy was  performed.  The intervertebral space was then liberally packed with autograft as well as allograft in the form of Vivigen, as was the appropriate-sized intervertebral spacer.  The spacer was then tamped into position in the usual fashion, and expanded to 8.85 mm in height. I was very pleased with the press-fit of the spacer.  I then placed 6 mm screws on the left at L4 and L5. A 40-mm rod was then placed and caps were placed. The distraction was then released on the contralateral side.  All caps were then locked.  The wound was copiously irrigated with a total of approximately 3 L prior to placing the bone graft.    I then turned my attention toward the posterolateral fusion.  On the right side, a high-speed bur was used to decorticate the right L4-5 facet joint and transverse processes.  Additional autograft and allograft was then packed into the posterolateral gutter on the right side to help aid in the success of the fusion.  The wound was  explored for any undue bleeding and there was no substantial bleeding encountered.  Gel-Foam was placed over the laminectomy site.  The wound was then closed in layers using #1 Vicryl followed by 2-0 Vicryl, followed by 4-0 Monocryl.  Benzoin and Steri-Strips were applied followed by sterile dressing.     Of note, Ileana Clara was my assistant throughout surgery, and did aid in retraction, suctioning, the decompression, placement of the hardware, and closure.     Oneil Priestly, MD

## 2023-12-08 NOTE — Anesthesia Preprocedure Evaluation (Signed)
 Anesthesia Evaluation  Patient identified by MRN, date of birth, ID band Patient awake    Reviewed: Allergy & Precautions, NPO status , Patient's Chart, lab work & pertinent test results  Airway Mallampati: II  TM Distance: >3 FB Neck ROM: Full    Dental   Pulmonary former smoker   breath sounds clear to auscultation       Cardiovascular negative cardio ROS  Rhythm:Regular Rate:Normal     Neuro/Psych negative neurological ROS     GI/Hepatic negative GI ROS, Neg liver ROS,,,  Endo/Other  negative endocrine ROS    Renal/GU negative Renal ROS     Musculoskeletal   Abdominal   Peds  Hematology negative hematology ROS (+)   Anesthesia Other Findings   Reproductive/Obstetrics                              Anesthesia Physical Anesthesia Plan  ASA: 2  Anesthesia Plan: General   Post-op Pain Management: Tylenol  PO (pre-op)*   Induction: Intravenous  PONV Risk Score and Plan: 2 and Dexamethasone , Ondansetron , Midazolam  and Treatment may vary due to age or medical condition  Airway Management Planned: Oral ETT  Additional Equipment: None  Intra-op Plan:   Post-operative Plan: Extubation in OR  Informed Consent: I have reviewed the patients History and Physical, chart, labs and discussed the procedure including the risks, benefits and alternatives for the proposed anesthesia with the patient or authorized representative who has indicated his/her understanding and acceptance.     Dental advisory given  Plan Discussed with: CRNA  Anesthesia Plan Comments:         Anesthesia Quick Evaluation

## 2023-12-08 NOTE — Anesthesia Procedure Notes (Signed)
 Procedure Name: Intubation Date/Time: 12/08/2023 11:27 AM  Performed by: Julien Manus, CRNAPre-anesthesia Checklist: Patient identified, Emergency Drugs available, Suction available and Patient being monitored Patient Re-evaluated:Patient Re-evaluated prior to induction Oxygen Delivery Method: Circle System Utilized Preoxygenation: Pre-oxygenation with 100% oxygen Induction Type: IV induction Ventilation: Mask ventilation without difficulty Laryngoscope Size: Mac and 4 Grade View: Grade I Tube type: Oral Tube size: 7.5 mm Number of attempts: 1 Airway Equipment and Method: Stylet and Oral airway Placement Confirmation: ETT inserted through vocal cords under direct vision, positive ETCO2 and breath sounds checked- equal and bilateral Secured at: 23 cm Tube secured with: Tape Dental Injury: Teeth and Oropharynx as per pre-operative assessment

## 2023-12-09 ENCOUNTER — Telehealth (HOSPITAL_COMMUNITY): Payer: Self-pay

## 2023-12-09 ENCOUNTER — Encounter (HOSPITAL_COMMUNITY): Payer: Self-pay | Admitting: Orthopedic Surgery

## 2023-12-09 ENCOUNTER — Other Ambulatory Visit (HOSPITAL_COMMUNITY): Payer: Self-pay

## 2023-12-09 DIAGNOSIS — M5116 Intervertebral disc disorders with radiculopathy, lumbar region: Secondary | ICD-10-CM | POA: Diagnosis not present

## 2023-12-09 MED ORDER — METHOCARBAMOL 500 MG PO TABS
500.0000 mg | ORAL_TABLET | Freq: Four times a day (QID) | ORAL | 2 refills | Status: AC | PRN
Start: 2023-12-09 — End: ?
  Filled 2023-12-09: qty 30, 8d supply, fill #0

## 2023-12-09 MED ORDER — OXYCODONE-ACETAMINOPHEN 5-325 MG PO TABS
1.0000 | ORAL_TABLET | ORAL | 0 refills | Status: AC | PRN
  Filled 2023-12-09: qty 30, 5d supply, fill #0

## 2023-12-09 MED FILL — Thrombin For Soln 20000 Unit: CUTANEOUS | Qty: 1 | Status: AC

## 2023-12-09 NOTE — Progress Notes (Signed)
    Patient doing well  Patient denies left leg pain Back pain is minimal   Physical Exam: Vitals:   12/08/23 2319 12/09/23 0315  BP: 103/64 117/64  Pulse: 62 61  Resp: 20 18  Temp: 98.6 F (37 C) 98.5 F (36.9 C)  SpO2: 97% 98%    Dressing in place NVI  POD #1 s/p revision decompression at L4-5, with an L4-5 fusion, doing well  - up with PT/OT, encourage ambulation - Percocet for pain, Robaxin  for muscle spasms - d/c home today with f/u in 2 weeks

## 2023-12-09 NOTE — Progress Notes (Signed)
 OT Cancellation Note and Discharge  Patient Details Name: Meet Weathington MRN: [id number] DOB: [birth year]   Cancelled Treatment:    Reason Eval/Treat Not Completed: OT screened, no needs identified, will sign off. Spoke with evaluating PT and no OT needs were identified by them.   Donny BECKER OT Acute Rehabilitation Services Office [phone]    Rodgers Dorothyann Distel 12/09/2023, 8:49 AM

## 2023-12-09 NOTE — Telephone Encounter (Signed)
 Pharmacy Patient Advocate Encounter   Received notification from Inpatient Request that prior authorization for Oxycodone -Acetaminophen  5-325mg  is required/requested.   Insurance verification completed.   The patient is insured through [facility] ADVANTAGE/RX ADVANCE .   Per test claim: PA required and submitted KEY/EOC/Request #: [id number] from 12/09/2023 to 06/06/2024

## 2023-12-09 NOTE — Evaluation (Signed)
 Physical Therapy Evaluation & Discharge Patient Details Name: Bobby Petersen MRN: [id number] DOB: 1970/06/27 Today's Date: 12/09/2023  History of Present Illness  Pt is a 53 y.o. male who presented 12/08/23 for elective PLIF L4-5. PMH: L4-5 decompression several years ago  Clinical Impression  Pt presents with condition above. PTA, pt was independent, working, traveling often, and living with his family in a 2-level house with 3 STE. The second level is the basement, which the pt frequently accesses to take his dog outside. Prior to surgery, reported numbness in entire foot and pain down posterior lower leg, but now since surgery pt only still has mild numbness at his 3rd and 4th toes and his plantar aspect of his foot and no longer has pain down his posterior lower leg. Currently, the pt is able to demonstrate good compliance to his spinal precautions and the capability to be mod I-independent for all functional mobility and ADLs. He demonstrated good comprehension and independence with donning and doffing his spinal brace. Educated pt to sit and obtain figure 4 position to reach lower body for ADLs to comply to his precautions. Educated pt to take x3 longer walks/day to tolerance, change positions frequently, positioning with pillows when supine or sidelying, sitting up for </= 45 min at a time, and car transfers. He verbalized understanding. All education completed and questions answered. No further skilled PT services needed at this time. PT will sign off. Thank you for this referral.       If plan is discharge home, recommend the following: Assistance with cooking/housework;Assist for transportation   Can travel by private vehicle        Equipment Recommendations None recommended by PT  Recommendations for Other Services       Functional Status Assessment Patient has had a recent decline in their functional status and demonstrates the ability to make significant improvements in function in  a reasonable and predictable amount of time.     Precautions / Restrictions Precautions Precautions: Fall;Back Precaution Booklet Issued: Yes (comment) Recall of Precautions/Restrictions: Intact Precaution/Restrictions Comments: reviewed precautions, pt demonstrated good compliance Required Braces or Orthoses: Spinal Brace Spinal Brace: Thoracolumbosacral orthotic;Applied in standing position (can apply sitting or standing per orders) Restrictions Weight Bearing Restrictions Per Provider Order: No      Mobility  Bed Mobility Overal bed mobility: Modified Independent             General bed mobility comments: Mildly increased time. Educated pt on proper technique with rolling and sidelying <> sit EOB. No assistance needed, good compliance noted    Transfers Overall transfer level: Independent Equipment used: None               General transfer comment: No LOB, no assistance needed    Ambulation/Gait Ambulation/Gait assistance: Independent Gait Distance (Feet): 600 Feet Assistive device: None Gait Pattern/deviations: WFL(Within Functional Limits) Gait velocity: WFL     General Gait Details: No drastic gait deviations noted. No overt LOB.  Stairs Stairs: Yes Stairs assistance: Modified independent (Device/Increase time) Stair Management: One rail Left, One rail Right, Alternating pattern, Forwards Number of Stairs: 10 General stair comments: Utilized L rail ascending and R rail descending. No LOB.  Wheelchair Mobility     Tilt Bed    Modified Rankin (Stroke Patients Only)       Balance Overall balance assessment: No apparent balance deficits (not formally assessed)  Pertinent Vitals/Pain Pain Assessment Pain Assessment: Faces Faces Pain Scale: Hurts a little bit Pain Location: back Pain Descriptors / Indicators: Discomfort, Operative site guarding Pain Intervention(s): Limited  activity within patient's tolerance, Monitored during session, Repositioned    Home Living Family/patient expects to be discharged to:: Private residence Living Arrangements: Spouse/significant other Available Help at Discharge: Family Type of Home: House Home Access: Stairs to enter Entrance Stairs-Rails: None Entrance Stairs-Number of Steps: 3 Alternate Level Stairs-Number of Steps: flight Home Layout: Two level;Able to live on main level with bedroom/bathroom;Laundry or work area in [employer] Equipment: Agricultural consultant (2 wheels);Shower seat      Prior Function Prior Level of Function : Independent/Modified Independent;Driving;Working/employed               ADLs Comments: Works from home and travels often     Extremity/Trunk Assessment   Upper Extremity Assessment Upper Extremity Assessment: Overall WFL for tasks assessed    Lower Extremity Assessment Lower Extremity Assessment: LLE deficits/detail (gross strength 5/5 with sensation intact on R) LLE Deficits / Details: prior to surgery, reported numbness in entire foot and pain down posterior lower leg; now since surgery pt only still has mild numbness at his 3rd and 4th toes and his plantar aspect of his foot and no longer has pain down his posterior lower leg; MMT scores of 5/5 grossly LLE Sensation: decreased light touch    Cervical / Trunk Assessment Cervical / Trunk Assessment: Back Surgery  Communication   Communication Communication: No apparent difficulties    Cognition Arousal: Alert Behavior During Therapy: WFL for tasks assessed/performed   PT - Cognitive impairments: No apparent impairments                         Following commands: Intact       Cueing Cueing Techniques: Verbal cues     General Comments General comments (skin integrity, edema, etc.): Educated pt to sit and obtain figure 4 position to reach lower body for ADLs to comply to his precautions. Educated pt to take x3  longer walks/day to tolerance, change positions frequently, positioning with pillows when supine or sidelying, sitting up for </= 45 min at a time, and car transfers. He verbalized understanding.    Exercises     Assessment/Plan    PT Assessment Patient does not need any further PT services  PT Problem List         PT Treatment Interventions      PT Goals (Current goals can be found in the Care Plan section)  Acute Rehab PT Goals Patient Stated Goal: to continue to improve PT Goal Formulation: All assessment and education complete, DC therapy Time For Goal Achievement: 12/10/23 Potential to Achieve Goals: Good    Frequency       Co-evaluation               AM-PAC PT 6 Clicks Mobility  Outcome Measure Help needed turning from your back to your side while in a flat bed without using bedrails?: None Help needed moving from lying on your back to sitting on the side of a flat bed without using bedrails?: None Help needed moving to and from a bed to a chair (including a wheelchair)?: None Help needed standing up from a chair using your arms (e.g., wheelchair or bedside chair)?: None Help needed to walk in hospital room?: None Help needed climbing 3-5 steps with a railing? : None 6 Click Score: 24  End of Session Equipment Utilized During Treatment: Back brace Activity Tolerance: Patient tolerated treatment well Patient left: in bed;with call bell/phone within reach;with family/visitor present Nurse Communication: Mobility status PT Visit Diagnosis: Pain;Other symptoms and signs involving the nervous system (R29.898) Pain - Right/Left:  (back) Pain - part of body:  (back)    Time: 9240-9170 PT Time Calculation (min) (ACUTE ONLY): 30 min   Charges:   PT Evaluation $PT Eval Low Complexity: 1 Low PT Treatments $Therapeutic Activity: 8-22 mins PT General Charges $$ ACUTE PT VISIT: 1 Visit         Theo Ferretti, PT, DPT Acute Rehabilitation  Services  Office: [phone]   Theo CHRISTELLA Ferretti 12/09/2023, 8:42 AM

## 2023-12-09 NOTE — Plan of Care (Signed)
 Pt doing well. Pt and wife given D/C instructions with verbal understanding. Rx's were sent to the pharmacy by MD. Pt's incision is clean and dry with no sign of infection. Pt's IV was removed prior to D/C. Pt D/C'd home via wheelchair per MD order. Pt is stable @ D/C and has no other needs at this time. Rema Fendt, RN

## 2023-12-22 NOTE — Discharge Summary (Signed)
 Patient ID: Laksh Hinners MRN: [id number] DOB/AGE: 1971/03/31 53 y.o.  Admit date: 12/08/2023 Discharge date: 12/09/2023  Admission Diagnoses:  Principal Problem:   Radiculopathy of lumbar region   Discharge Diagnoses:  Same  Past Medical History:  Diagnosis Date   Complication of anesthesia    had 2 surgery closed together and stayed drunk foer a bit   Constipation    Pneumonia    as a teenager    Surgeries: Procedure(s): TRANSFORAMINAL LUMBAR INTERBODY FUSION (TLIF) WITH PEDICLE SCREW FIXATION 1 LEVEL on 12/08/2023   Consultants: None  Discharged Condition: Improved  Hospital Course: Oluwatomisin Deman is an 53 y.o. male who was admitted 12/08/2023 for operative treatment of Radiculopathy of lumbar region. Patient has severe unremitting pain that affects sleep, daily activities, and work/hobbies. After pre-op clearance the patient was taken to the operating room on 12/08/2023 and underwent  Procedure(s): TRANSFORAMINAL LUMBAR INTERBODY FUSION (TLIF) WITH PEDICLE SCREW FIXATION 1 LEVEL.    Patient was given perioperative antibiotics:  Anti-infectives (From admission, onward)    Start     Dose/Rate Route Frequency Ordered Stop   12/08/23 2000  ceFAZolin  (ANCEF ) IVPB 2g/100 mL premix        2 g 200 mL/hr over 30 Minutes Intravenous Every 8 hours 12/08/23 1654 12/09/23 0459   12/08/23 0800  ceFAZolin  (ANCEF ) IVPB 2g/100 mL premix        2 g 200 mL/hr over 30 Minutes Intravenous On call to O.R. 12/08/23 0749 12/08/23 1135        Patient was given sequential compression devices, early ambulation to prevent DVT.  Patient benefited maximally from hospital stay and there were no complications.    Recent vital signs: BP 106/76 (BP Location: Right Arm)   Pulse 69   Temp 98.5 F (36.9 C) (Oral)   Resp 18   Ht 6' (1.829 m)   Wt 101.9 kg   SpO2 100%   BMI 30.46 kg/m    Discharge Medications:   Allergies as of 12/09/2023       Reactions   Bupropion Dermatitis    Penicillins Rash   Childhood allergy        Medication List     TAKE these medications    acetaminophen  500 MG tablet Commonly known as: TYLENOL  Take 500-1,000 mg by mouth every 6 (six) hours as needed (pain.).   BERBERINE HCI PO Take 1 capsule by mouth in the morning. Berberine Phytosome   cetirizine 10 MG tablet Commonly known as: ZYRTEC Take 10 mg by mouth every evening.   Creatine Powd Take 5 g by mouth in the morning.   D3 + K2 PO Take 1 tablet by mouth in the morning.   Derma-Smoothe/FS Body 0.01 % Oil Generic drug: Fluocinolone Acetonide Body Apply 1 Application topically daily as needed (scalp dermatitis (once a month)).   MAGNESIUM  GLYCINATE PO Take 100 mg by mouth at bedtime.   methocarbamol  500 MG tablet Commonly known as: ROBAXIN  Take 1 tablet (500 mg total) by mouth every 6 (six) hours as needed for muscle spasms.   OVER THE COUNTER MEDICATION Take 1 packet by mouth as needed (dip). Nicotine Free Herbal Dip/Snuff (averaging 20/day)   oxyCODONE -acetaminophen  5-325 MG tablet Commonly known as: PERCOCET/ROXICET Take 1-2 tablets by mouth every 4 (four) hours as needed for severe pain (pain score 7-10).   traMADol 50 MG tablet Commonly known as: ULTRAM Take 50 mg by mouth every 6 (six) hours as needed for severe pain (pain score 7-10).  VITAMIN B-12 SL Place 1 tablet under the tongue daily.        Diagnostic Studies: DG Lumbar Spine 2-3 Views Result Date: 12/08/2023 CLINICAL DATA:  Elective surgery. EXAM: LUMBAR SPINE - 2-3 VIEW COMPARISON:  None Available. FINDINGS: Two fluoroscopic spot views of the lumbar spine submitted from the operating room. Posterior rod and pedicle screw fixation with interbody spacer L4-L5. Fluoroscopy time 30 seconds. Dose 17.15 mGy. IMPRESSION: Intraoperative fluoroscopy during lumbar fusion. Electronically Signed   By: Andrea Gasman M.D.   On: 12/08/2023 17:19   DG Lumbar Spine 1 View Result Date:  12/08/2023 CLINICAL DATA:  Surgical localization. EXAM: LUMBAR SPINE - 1 VIEW COMPARISON:  MRI of October 05, 2023. FINDINGS: Single intraoperative cross-table lateral projection of the lumbar spine was obtained. This demonstrates surgical probes directed toward the posterior spinous processes of L3 and L5. IMPRESSION: Surgical localization as described above. Electronically Signed   By: Lynwood Landy Raddle M.D.   On: 12/08/2023 16:03   DG C-Arm 1-60 Min-No Report Result Date: 12/08/2023 Fluoroscopy was utilized by the requesting physician.  No radiographic interpretation.   DG C-Arm 1-60 Min-No Report Result Date: 12/08/2023 Fluoroscopy was utilized by the requesting physician.  No radiographic interpretation.   DG C-Arm 1-60 Min-No Report Result Date: 12/08/2023 Fluoroscopy was utilized by the requesting physician.  No radiographic interpretation.    Disposition: Discharge disposition: 01-Home or Self Care        POD #1 s/p revision decompression at L4-5, with an L4-5 fusion, doing well   - up with PT/OT, encourage ambulation - Percocet for pain, Robaxin  for muscle spasms -Scripts for pain sent to pharmacy electronically  -D/C instructions sheet printed and in chart -D/C today  -F/U in office 2 weeks   Signed: Ileana PARAS Greenlee Ancheta 12/22/2023, 12:54 PM
